# Patient Record
Sex: Female | Born: 1980 | Race: White | Hispanic: No | Marital: Single | Smoking: Current every day smoker
Health system: Southern US, Community
[De-identification: ages and names within clinical notes are randomized; demographics above are authoritative.]

## PROBLEM LIST (undated history)

## (undated) DIAGNOSIS — G43909 Migraine, unspecified, not intractable, without status migrainosus: Secondary | ICD-10-CM

## (undated) DIAGNOSIS — N83209 Unspecified ovarian cyst, unspecified side: Secondary | ICD-10-CM

## (undated) DIAGNOSIS — G43109 Migraine with aura, not intractable, without status migrainosus: Secondary | ICD-10-CM

## (undated) HISTORY — PX: TUBAL LIGATION: SHX77

## (undated) HISTORY — DX: Migraine with aura, not intractable, without status migrainosus: G43.109

## (undated) HISTORY — DX: Unspecified ovarian cyst, unspecified side: N83.209

---

## 2004-08-08 ENCOUNTER — Emergency Department: Payer: Self-pay | Admitting: Emergency Medicine

## 2005-08-10 ENCOUNTER — Emergency Department: Payer: Self-pay | Admitting: Emergency Medicine

## 2005-11-28 IMAGING — US ABDOMEN ULTRASOUND
1 series · 17 of 25 positions shown · non-contrast
Comparison: none

REASON FOR EXAM: Right upper quadrant abdominal pain.
COMMENTS:

[Series 1: abdomen ultrasound · 17 of 50 slices shown]
[im 1/50]
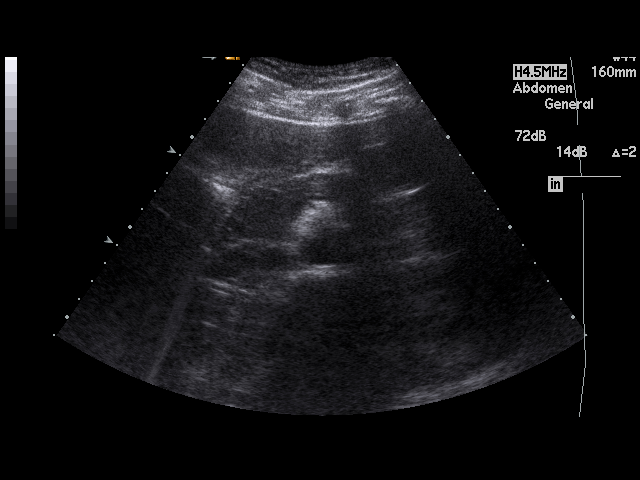
[im 5/50]
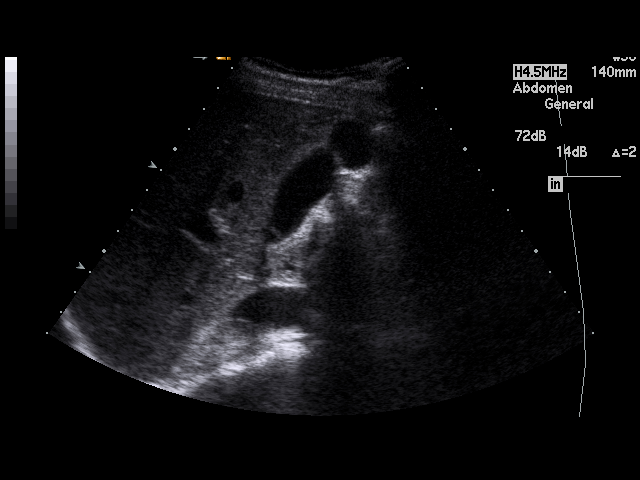
[im 7/50]
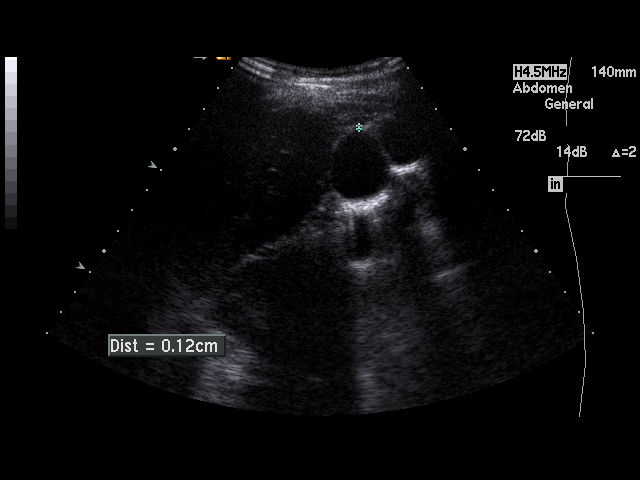
[im 11/50]
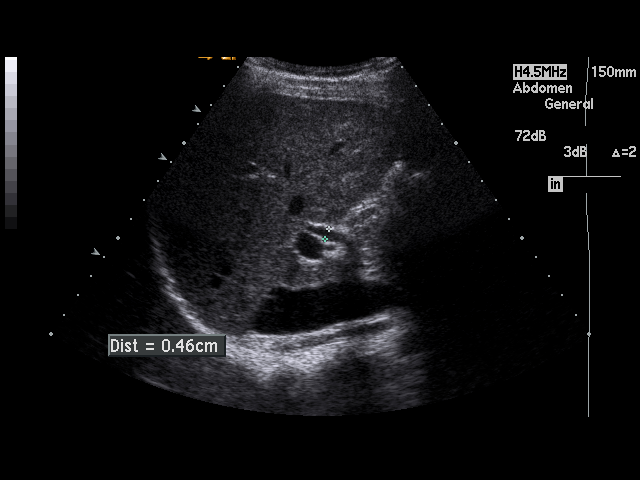
[im 13/50]
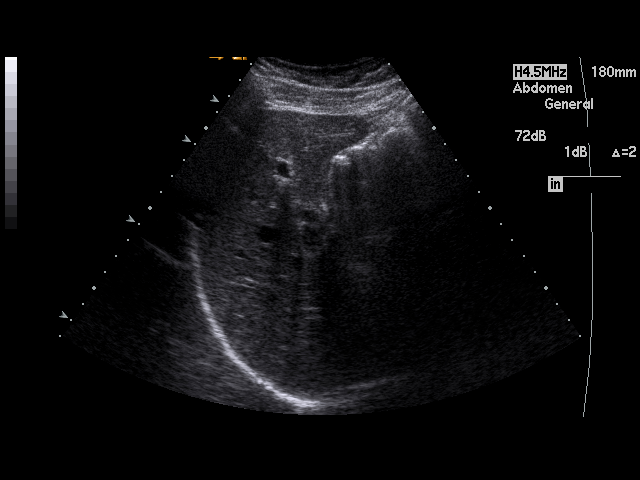
[im 17/50]
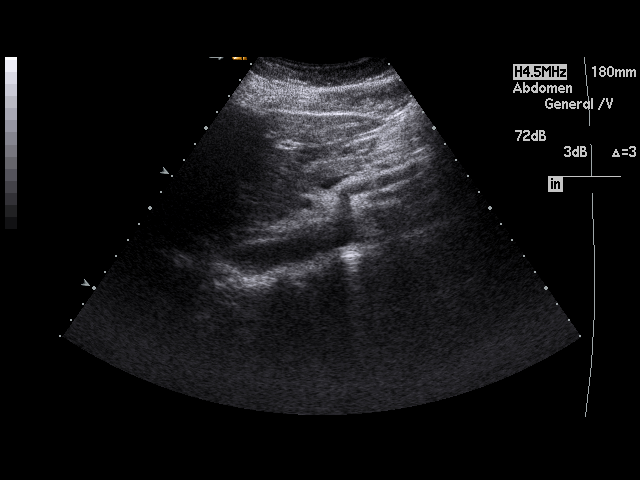
[im 19/50]
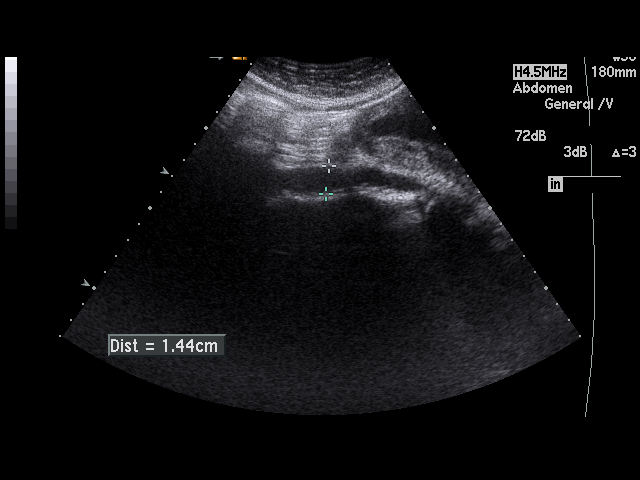
[im 23/50]
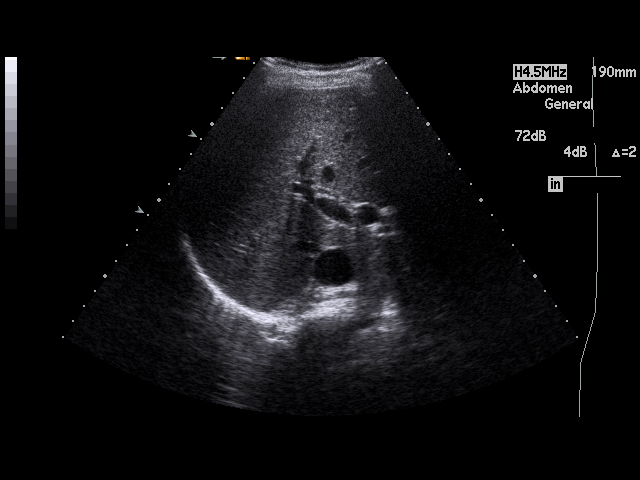
[im 25/50]
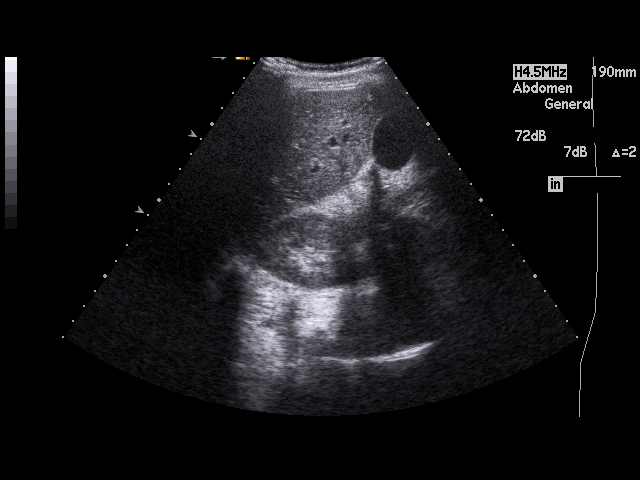
[im 27/50]
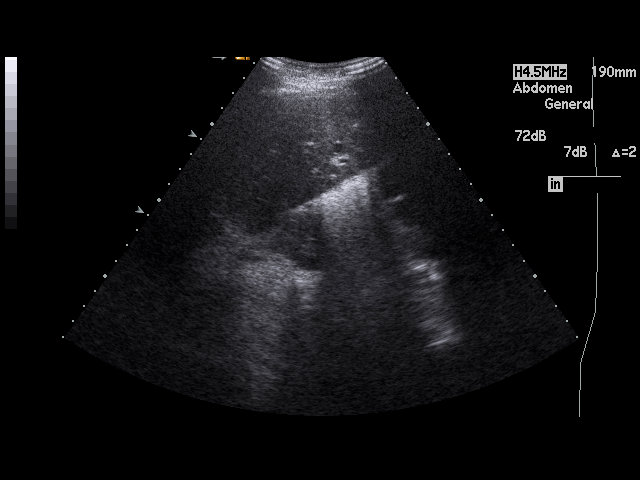
[im 31/50]
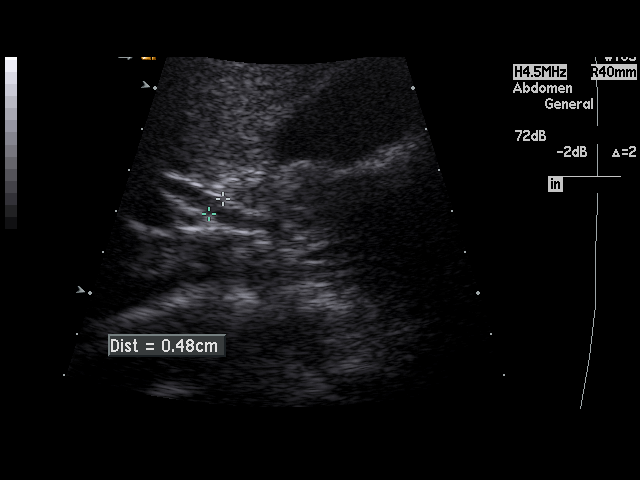
[im 33/50]
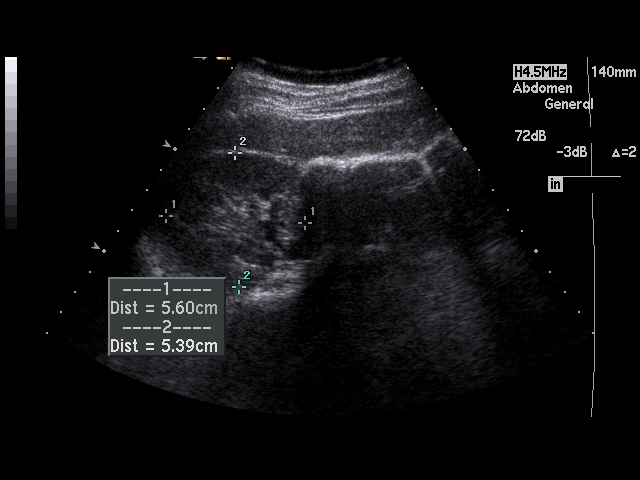
[im 37/50]
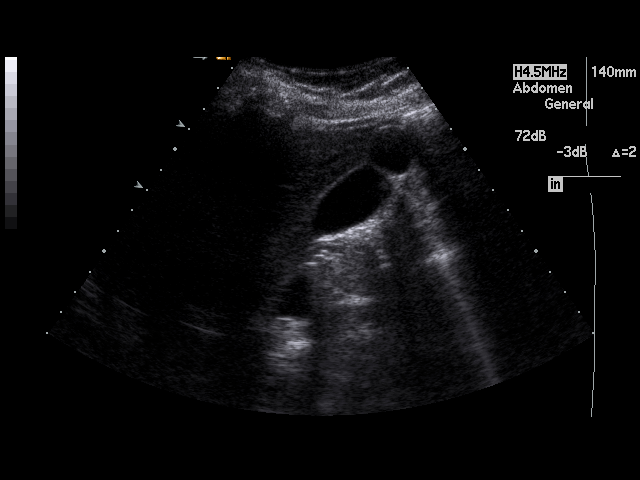
[im 39/50]
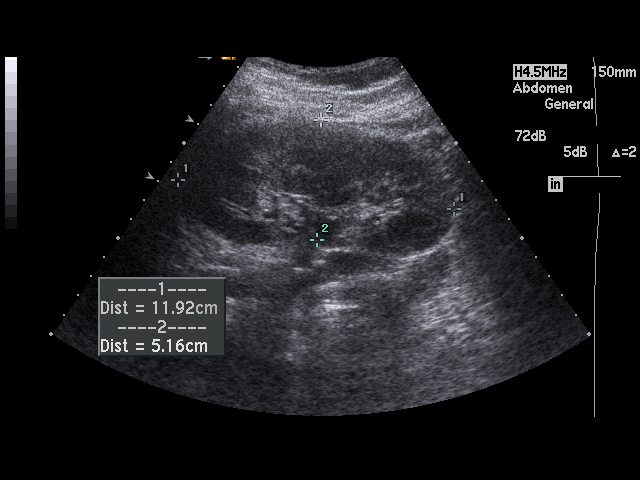
[im 43/50]
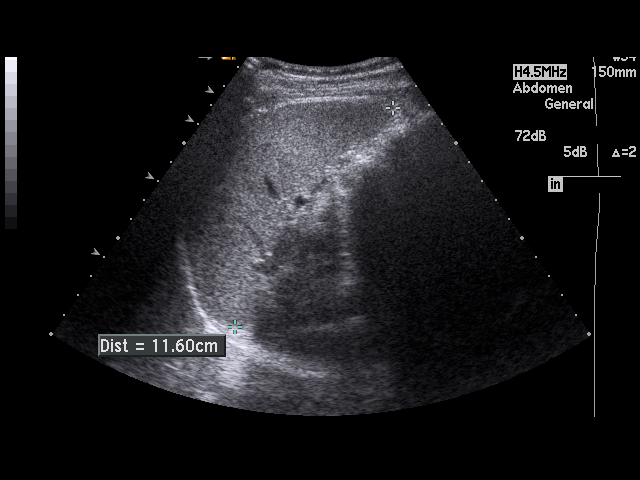
[im 45/50]
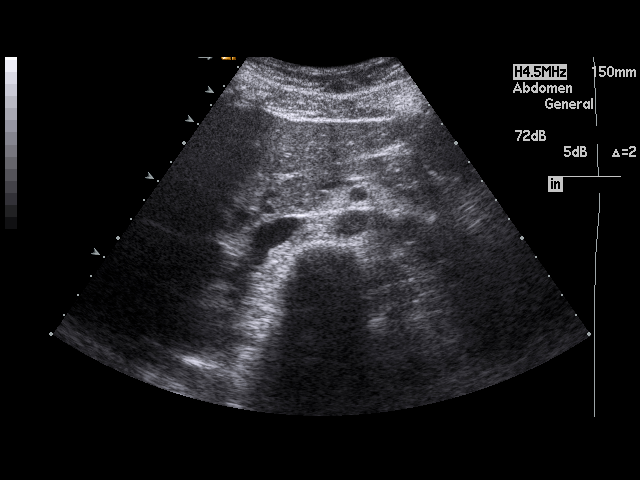
[im 50/50]
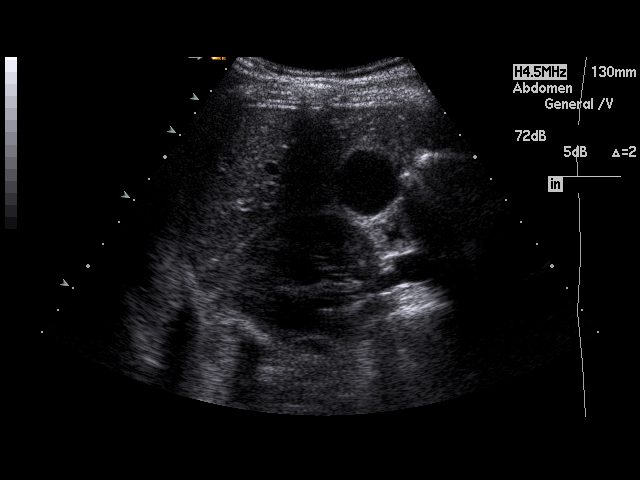

[17 of 25 positions shown; findings below may reference images not displayed]

PROCEDURE:     US  - US ABDOMEN GENERAL SURVEY  - August 10, 2005  [DATE]

RESULT:        The patient is complaining of RIGHT upper quadrant abdominal
discomfort.
The liver exhibits normal echotexture with no evidence of a mass nor ductal
dilation.  The gallbladder is adequately distended with no evidence of
stones, wall thickening or pericholecystic fluid.  The common bile duct
measures 4.6 mm in diameter.  The pancreas, spleen and abdominal aorta are
normal in appearance.    The kidneys exhibit no evidence of obstruction nor
of masses. The RIGHT kidney is smaller than the LEFT.  The LEFT kidney
measures 11.9 cm in length and the RIGHT kidney 9.2 cm in length.  There is
no evidence of ascites.
IMPRESSION: Normal abdominal ultrasound examination.

Preliminary report was sent to the Emergency Room at the conclusion of the
study.

## 2006-01-05 ENCOUNTER — Observation Stay: Payer: Self-pay

## 2006-01-16 ENCOUNTER — Inpatient Hospital Stay: Payer: Self-pay | Admitting: Obstetrics & Gynecology

## 2009-11-14 ENCOUNTER — Ambulatory Visit (HOSPITAL_COMMUNITY): Admission: RE | Admit: 2009-11-14 | Discharge: 2009-11-14 | Payer: Self-pay | Admitting: Family Medicine

## 2016-07-11 ENCOUNTER — Ambulatory Visit
Admission: EM | Admit: 2016-07-11 | Discharge: 2016-07-11 | Disposition: A | Discharge status: Home / Self Care | Payer: Medicaid Other | Attending: Family Medicine | Admitting: Family Medicine

## 2016-07-11 DIAGNOSIS — F17210 Nicotine dependence, cigarettes, uncomplicated: Secondary | ICD-10-CM | POA: Insufficient documentation

## 2016-07-11 DIAGNOSIS — J029 Acute pharyngitis, unspecified: Secondary | ICD-10-CM | POA: Diagnosis present

## 2016-07-11 DIAGNOSIS — J020 Streptococcal pharyngitis: Secondary | ICD-10-CM | POA: Diagnosis not present

## 2016-07-11 HISTORY — DX: Migraine, unspecified, not intractable, without status migrainosus: G43.909

## 2016-07-11 LAB — RAPID STREP SCREEN (MED CTR MEBANE ONLY): Streptococcus, Group A Screen (Direct): POSITIVE — AB

## 2016-07-11 MED ORDER — PENICILLIN G BENZATHINE 1200000 UNIT/2ML IM SUSP
1.2000 10*6.[IU] | Freq: Once | INTRAMUSCULAR | Status: AC
  Administered 2016-07-11: 1.2 10*6.[IU] via INTRAMUSCULAR

## 2016-07-11 NOTE — ED Provider Notes (Signed)
[facility] ____________________________________________  Time seen: Approximately 4:29 PM  I have reviewed the triage vital signs and the nursing notes.   HISTORY  Chief Complaint Sore Throat  HPI Cassandra Lane is a 35 y.o. female presents for complaints of sore throat since last night. Patient reports throat feels very irritated and with pain to swallow. Reports able to swallow and continues to drink fluids but has not been eating as much. Denies known fevers. Denies cough, congestion, nasal drainage or recent sickness. Denies recent antibiotic use. Denies known sick contacts at home sick contacts. Patient reports that she does have 4 young children. Reports has not been taking medication for same complaints.  [facility] Patient's last menstrual period was 07/02/2016. Declines pregnancy. Reports tubal ligation.   Past Medical History:  Diagnosis Date  . Migraines     There are no active problems to display for this patient.   Past Surgical History:  Procedure Laterality Date  . TUBAL LIGATION      Current Outpatient Rx  . Order #: [id number] Class: Historical Med  . Order #: [id number] Class: Historical Med    No current facility-administered medications for this encounter.   Current Outpatient Prescriptions:  .  gabapentin (NEURONTIN) 300 MG capsule, Take 300 mg by mouth 3 (three) times daily., Disp: , Rfl:  .  gabapentin (NEURONTIN) 600 MG tablet, Take 600 mg by mouth 3 (three) times daily., Disp: , Rfl:   Allergies Review of patient's allergies indicates no known allergies.  History reviewed. No pertinent family history.  Social History Social History  Substance Use Topics  . Smoking status: Current Every Day Smoker    Packs/day: 1.00    Types: Cigarettes  . Smokeless tobacco: Never Used  . Alcohol use No    Review of Systems Constitutional: No fever/chills Eyes: No visual changes. ENT: Positive sore  throat. Cardiovascular: Denies chest pain. Respiratory: Denies shortness of breath. Gastrointestinal: No abdominal pain.  No nausea, no vomiting.  No diarrhea.  No constipation. Genitourinary: Negative for dysuria. Musculoskeletal: Negative for back pain. Skin: Negative for rash. Neurological: Negative for headaches, focal weakness or numbness.  10-point ROS otherwise negative.  ____________________________________________   PHYSICAL EXAM:  VITAL SIGNS: ED Triage Vitals  Enc Vitals Group     BP 07/11/16 1613 115/79     Pulse Rate 07/11/16 1613 78     Resp 07/11/16 1613 16     Temp 07/11/16 1613 98.9 F (37.2 C)     Temp Source 07/11/16 1613 Oral     SpO2 07/11/16 1613 100 %     Weight 07/11/16 1614 125 lb (56.7 kg)     Height 07/11/16 1614 5\' 1"  (1.549 m)     Head Circumference --      Peak Flow --      Pain Score 07/11/16 1616 10     Pain Loc --      Pain Edu? --      Excl. in GC? --    Constitutional: Alert and oriented. Well appearing and in no acute distress. Eyes: Conjunctivae are normal. PERRL. EOMI. Head: Atraumatic. No sinus tenderness to palpation. No swelling. No erythema.  Ears: no erythema, normal TMs bilaterally.   Nose: No nasal congestion or rhinorrhea.  Mouth/Throat: Mucous membranes are moist. Moderate pharyngeal erythema, mild bilateral tonsillar swelling, no exudate. No uvular shift or deviation. Neck: No stridor.  No cervical spine tenderness to palpation. Hematological/Lymphatic/Immunilogical: Mild anterior bilateral cervical lymphadenopathy. Cardiovascular: Normal  rate, regular rhythm. Grossly normal heart sounds.  Good peripheral circulation. Respiratory: Normal respiratory effort.  No retractions. Lungs CTAB.No wheezes, rales or rhonchi. Good air movement.  Gastrointestinal: Soft and nontender. Normal Bowel sounds. No CVA tenderness. No hepatosplenomegaly palpated. Musculoskeletal: Ambulatory with steady gait. No cervical, thoracic or lumbar  tenderness to palpation. Neurologic:  Normal speech and language. No gross focal neurologic deficits are appreciated. No gait instability. Skin:  Skin is warm, dry and intact. No rash noted. Psychiatric: Mood and affect are normal. Speech and behavior are normal. ___________________________________________   LABS (all labs ordered are listed, but only abnormal results are displayed)  Labs Reviewed  RAPID STREP SCREEN (NOT AT [facility]) - Abnormal; Notable for the following:       Result Value   Streptococcus, Group A Screen (Direct) POSITIVE (*)    All other components within normal limits     PROCEDURES Procedures    INITIAL IMPRESSION / ASSESSMENT AND PLAN / ED COURSE  Pertinent labs & imaging results that were available during my care of the patient were reviewed by me and considered in my medical decision making (see chart for details).  Well-appearing patient. No acute distress. Presents with complaints of sore throat since last night. Denies accompanying symptoms. Quick strep positive. Discussed option treatments with patient, and patient request penicillin G IM. Penicillin G 1.2 million units given once IM in urgent care. Patient tolerated well. Discussed follow-up and return parameters. Encouraged supportive care and fluids and rest.   Discussed follow up with Primary care physician this week. Discussed follow up and return parameters including no resolution or any worsening concerns. Patient verbalized understanding and agreed to plan.   ____________________________________________   FINAL CLINICAL IMPRESSION(S) / ED DIAGNOSES  Final diagnoses:  Strep pharyngitis     Discharge Medication List as of 07/11/2016  4:41 PM      Note: This dictation was prepared with Dragon dictation along with smaller phrase technology. Any transcriptional errors that result from this process are unintentional.    Clinical Course      Renford Dills, NP 07/11/16 2215

## 2016-07-11 NOTE — ED Triage Notes (Signed)
Sore throat x last night. Hurts to swallow and feels like she is losing her voice. Pain 10/10. Unsure if fevers. Denies other sx.

## 2016-07-11 NOTE — Discharge Instructions (Signed)
Rest. Drink plenty of fluids.  ° °Follow up with your primary care physician this week as needed. Return to Urgent care for new or worsening concerns.  ° °

## 2016-09-19 ENCOUNTER — Encounter: Payer: Self-pay | Admitting: Emergency Medicine

## 2016-09-19 ENCOUNTER — Ambulatory Visit
Admission: EM | Admit: 2016-09-19 | Discharge: 2016-09-19 | Disposition: A | Discharge status: Home / Self Care | Payer: Medicaid Other | Attending: Internal Medicine | Admitting: Internal Medicine

## 2016-09-19 DIAGNOSIS — J029 Acute pharyngitis, unspecified: Secondary | ICD-10-CM | POA: Diagnosis not present

## 2016-09-19 DIAGNOSIS — Z79899 Other long term (current) drug therapy: Secondary | ICD-10-CM | POA: Diagnosis not present

## 2016-09-19 DIAGNOSIS — F17210 Nicotine dependence, cigarettes, uncomplicated: Secondary | ICD-10-CM | POA: Insufficient documentation

## 2016-09-19 LAB — RAPID STREP SCREEN (MED CTR MEBANE ONLY): STREPTOCOCCUS, GROUP A SCREEN (DIRECT): NEGATIVE

## 2016-09-19 MED ORDER — LIDOCAINE VISCOUS 2 % MT SOLN: 10.0000 mL | OROMUCOSAL | 0 refills | Status: DC | PRN

## 2016-09-19 NOTE — Discharge Instructions (Signed)
Recommend start Lidocaine mouth wash - swish and spit out every 4 hours as needed for pain. Follow-up in 3 to 4 days with your primary care provider if not improving.

## 2016-09-19 NOTE — ED Triage Notes (Signed)
Patient c/o sore throat that started yesterday.  

## 2016-09-19 NOTE — ED Provider Notes (Signed)
CSN: [id number]     Arrival date & time 09/19/16  [id number] History   First MD Initiated Contact with Patient 09/19/16 1011     Chief Complaint  Patient presents with  . Sore Throat   (Consider location/radiation/quality/duration/timing/severity/associated sxs/prior Treatment) 35 year old female presents with sore throat that started yesterday. Also experiencing post nasal drainage, slight nasal congestion and cough. Denies any fever or GI symptoms. Has taken Sucrets with minimal relief. Had strep in Oct 2017, symptoms feel similar. Son also was sick with URI symptoms and had pink eye earlier this week- has recovered now. She does smoke about 1 pack per day and takes Neurontin daily for migraines. 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    The history is provided by the patient.    Past Medical History:  Diagnosis Date  . Migraines    Past Surgical History:  Procedure Laterality Date  . TUBAL LIGATION     History reviewed. No pertinent family history. Social History  Substance Use Topics  . Smoking status: Current Every Day Smoker    Packs/day: 1.00    Types: Cigarettes  . Smokeless tobacco: Never Used  . Alcohol use No   OB History    No data available     Review of Systems  Constitutional: Positive for fatigue. Negative for appetite change, chills and fever.  HENT: Positive for postnasal drip and sore throat. Negative for congestion, ear discharge, ear pain, sinus pain and sinus pressure.   Respiratory: Positive for cough. Negative for chest tightness, shortness of breath and wheezing.   Cardiovascular: Negative for chest pain.  Gastrointestinal: Negative for abdominal pain, diarrhea, nausea and vomiting.  Musculoskeletal: Negative for neck pain and neck stiffness.  Skin: Negative for rash.  Neurological: Positive for headaches. Negative for dizziness, weakness and light-headedness.  Hematological: Negative for  adenopathy.    Allergies  Patient has no known allergies.  Home Medications   Prior to Admission medications   Medication Sig Start Date End Date Taking? Authorizing Provider  gabapentin (NEURONTIN) 600 MG tablet Take 600 mg by mouth 2 (two) times daily.     Historical Provider, MD  lidocaine (XYLOCAINE) 2 % solution Use as directed 10 mLs in the mouth or throat every 4 (four) hours as needed (for sore throat). 09/19/16   Sudie GrumblingAnn Berry Jasmine Mcbeth, NP   Meds Ordered and Administered this Visit  Medications - No data to display  BP 120/90 (BP Location: Left Arm)   Pulse 91   Temp 98.5 F (36.9 C) (Oral)   Resp 16   Ht 5\' 1"  (1.549 m)   Wt 125 lb (56.7 kg)   LMP 09/16/2016 (Exact Date)   SpO2 100%   BMI 23.62 kg/m  No data found.   Physical Exam  Constitutional: She is oriented to person, place, and time. She appears well-developed and well-nourished. No distress.  HENT:  Head: Normocephalic and atraumatic.  Right Ear: Hearing, tympanic membrane, external ear and ear canal normal.  Left Ear: Hearing, tympanic membrane, external ear and ear canal normal.  Nose: Nose normal. Right sinus exhibits no maxillary sinus tenderness and no frontal sinus tenderness. Left sinus exhibits no maxillary sinus tenderness and no frontal sinus tenderness.  Mouth/Throat: Uvula is midline and mucous membranes are normal. Posterior oropharyngeal erythema present. No tonsillar exudate.  Neck: Normal range of motion. Neck supple.  Cardiovascular: Normal rate, regular rhythm and normal heart sounds.   Pulmonary/Chest: Effort normal  and breath sounds normal. No respiratory distress. She has no decreased breath sounds. She has no wheezes. She has no rhonchi.  Lymphadenopathy:    She has no cervical adenopathy.  Neurological: She is alert and oriented to person, place, and time.  Skin: Skin is warm and dry. Capillary refill takes less than 2 seconds.  Psychiatric: She has a normal mood and affect. Her behavior  is normal. Judgment and thought content normal.    Urgent Care Course   Clinical Course     Procedures (including critical care time)  Labs Review Labs Reviewed  RAPID STREP SCREEN (NOT AT [facility])  CULTURE, GROUP A STREP [facility]    Imaging Review No results found.   Visual Acuity Review  Right Eye Distance:   Left Eye Distance:   Bilateral Distance:    Right Eye Near:   Left Eye Near:    Bilateral Near:         MDM   1. Pharyngitis, unspecified etiology    Reviewed negative rapid strep test with patient. Probably has a viral illness. Recommend take Lidocaine mouth wash- swish and spit out every 4 hours as needed for pain. Rest. Encouraged to decrease smoking. Follow-up with her primary care provider in 3 to 4 days if not improving.      Sudie GrumblingAnn Berry Quinette Hentges, NP 09/19/16 1034

## 2016-09-21 LAB — CULTURE, GROUP A STREP (THRC)

## 2016-09-23 ENCOUNTER — Telehealth: Payer: Self-pay

## 2016-09-23 ENCOUNTER — Telehealth: Payer: Self-pay | Admitting: Internal Medicine

## 2016-09-23 MED ORDER — PENICILLIN V POTASSIUM 500 MG PO TABS: 500.0000 mg | ORAL_TABLET | Freq: Two times a day (BID) | ORAL | 0 refills | Status: AC

## 2016-09-23 NOTE — Telephone Encounter (Signed)
Please let patient know that throat culture was positive for small number of strep.  If still having sore throat/fever, ok to send rx for penicillin V 500mg  bid x 10 d # 20 no refills.  Recheck as needed.  LM

## 2016-09-23 NOTE — Telephone Encounter (Signed)
Spoke to patient and informed her of her positive strep culture and that a antibiotic had been called in for her. She mentioned that she has had some yellow dots come up in her throat and on her tongue. I spoke with Colon FlatteryBill Roemer, PA and he suggested picking up the medication and giving it a few days to start working, if she doesn't see any improvement in a couple days to follow up.

## 2016-09-29 DIAGNOSIS — N83209 Unspecified ovarian cyst, unspecified side: Secondary | ICD-10-CM

## 2016-09-29 HISTORY — DX: Unspecified ovarian cyst, unspecified side: N83.209

## 2016-11-23 ENCOUNTER — Emergency Department: Payer: Medicaid Other

## 2016-11-23 ENCOUNTER — Encounter: Payer: Self-pay | Admitting: Emergency Medicine

## 2016-11-23 ENCOUNTER — Emergency Department
Admission: EM | Admit: 2016-11-23 | Discharge: 2016-11-23 | Disposition: A | Discharge status: Home / Self Care | Payer: Medicaid Other | Attending: Emergency Medicine | Admitting: Emergency Medicine

## 2016-11-23 DIAGNOSIS — Z79899 Other long term (current) drug therapy: Secondary | ICD-10-CM | POA: Diagnosis not present

## 2016-11-23 DIAGNOSIS — R1030 Lower abdominal pain, unspecified: Secondary | ICD-10-CM | POA: Diagnosis present

## 2016-11-23 DIAGNOSIS — R102 Pelvic and perineal pain: Secondary | ICD-10-CM

## 2016-11-23 DIAGNOSIS — R55 Syncope and collapse: Secondary | ICD-10-CM

## 2016-11-23 DIAGNOSIS — N83202 Unspecified ovarian cyst, left side: Secondary | ICD-10-CM | POA: Insufficient documentation

## 2016-11-23 DIAGNOSIS — F17210 Nicotine dependence, cigarettes, uncomplicated: Secondary | ICD-10-CM | POA: Insufficient documentation

## 2016-11-23 LAB — URINALYSIS, COMPLETE (UACMP) WITH MICROSCOPIC
BILIRUBIN URINE: NEGATIVE
Bacteria, UA: NONE SEEN
GLUCOSE, UA: NEGATIVE mg/dL
Hgb urine dipstick: NEGATIVE
KETONES UR: NEGATIVE mg/dL
LEUKOCYTES UA: NEGATIVE
NITRITE: NEGATIVE
PH: 7 (ref 5.0–8.0)
PROTEIN: NEGATIVE mg/dL
RBC / HPF: NONE SEEN RBC/hpf (ref 0–5)
Specific Gravity, Urine: 1.006 (ref 1.005–1.030)

## 2016-11-23 LAB — CBC
HEMATOCRIT: 37.4 % (ref 35.0–47.0)
Hemoglobin: 12.7 g/dL (ref 12.0–16.0)
MCH: 28.1 pg (ref 26.0–34.0)
MCHC: 34.1 g/dL (ref 32.0–36.0)
MCV: 82.4 fL (ref 80.0–100.0)
PLATELETS: 321 10*3/uL (ref 150–440)
RBC: 4.54 MIL/uL (ref 3.80–5.20)
RDW: 16.3 % — AB (ref 11.5–14.5)
WBC: 10.4 10*3/uL (ref 3.6–11.0)

## 2016-11-23 LAB — COMPREHENSIVE METABOLIC PANEL
ALT: 13 U/L — ABNORMAL LOW (ref 14–54)
AST: 17 U/L (ref 15–41)
Albumin: 3.8 g/dL (ref 3.5–5.0)
Alkaline Phosphatase: 58 U/L (ref 38–126)
Anion gap: 5 (ref 5–15)
BILIRUBIN TOTAL: 0.2 mg/dL — AB (ref 0.3–1.2)
BUN: 8 mg/dL (ref 6–20)
CHLORIDE: 108 mmol/L (ref 101–111)
CO2: 25 mmol/L (ref 22–32)
CREATININE: 0.74 mg/dL (ref 0.44–1.00)
Calcium: 9.1 mg/dL (ref 8.9–10.3)
Glucose, Bld: 94 mg/dL (ref 65–99)
POTASSIUM: 4.1 mmol/L (ref 3.5–5.1)
Sodium: 138 mmol/L (ref 135–145)
TOTAL PROTEIN: 6.9 g/dL (ref 6.5–8.1)

## 2016-11-23 LAB — LIPASE, BLOOD: LIPASE: 27 U/L (ref 11–51)

## 2016-11-23 LAB — POCT PREGNANCY, URINE: Preg Test, Ur: NEGATIVE

## 2016-11-23 MED ORDER — SODIUM CHLORIDE 0.9 % IV BOLUS (SEPSIS)
1000.0000 mL | Freq: Once | INTRAVENOUS | Status: AC
  Administered 2016-11-23: 1000 mL via INTRAVENOUS

## 2016-11-23 MED ORDER — KETOROLAC TROMETHAMINE 30 MG/ML IJ SOLN
INTRAMUSCULAR | Status: AC
  Administered 2016-11-23: 15 mg via INTRAVENOUS
  Filled 2016-11-23: qty 1

## 2016-11-23 MED ORDER — KETOROLAC TROMETHAMINE 30 MG/ML IJ SOLN
15.0000 mg | Freq: Once | INTRAMUSCULAR | Status: AC
  Administered 2016-11-23: 15 mg via INTRAVENOUS

## 2016-11-23 NOTE — ED Provider Notes (Signed)
Time Seen: Approximately 1434  I have reviewed the triage notes  Chief Complaint: Abdominal Pain; Vaginal Pain; and Loss of Consciousness   History of Present Illness: Cassandra Lane is a 36 y.o. female who states that she was working in the house today when she had sudden onset of lower middle quadrant abdominal pain and describes suprapubic vaginal discomfort. She states it felt like she had "" something coming out of her vagina "". She denies any recent sexual activity or vaginal discharge or bleeding. She states shortly after the pain she started feeling very lightheaded and nauseated and sweaty and had a brief syncopal episode. She denies any preexistent chest pain, shortness of breath or any other concerns. She states she still has the pain but otherwise denies any nausea vomiting or fever. There was no witnessed seizure activity.   Past Medical History:  Diagnosis Date  . Migraines     There are no active problems to display for this patient.   Past Surgical History:  Procedure Laterality Date  . TUBAL LIGATION      Past Surgical History:  Procedure Laterality Date  . TUBAL LIGATION      Current Outpatient Rx  . Order #: [id number] Class: Historical Med  . Order #: [id number] Class: Normal    Allergies:  Patient has no known allergies.  Family History: No family history on file.  Social History: Social History  Substance Use Topics  . Smoking status: Current Every Day Smoker    Packs/day: 1.00    Types: Cigarettes  . Smokeless tobacco: Never Used  . Alcohol use No     Review of Systems:   10 point review of systems was performed and was otherwise negative:  Constitutional: No fever Eyes: No visual disturbances ENT: No sore throat, ear pain Cardiac: No chest pain Respiratory: No shortness of breath, wheezing, or stridor Abdomen: Abdominal pain is lower middle region without diarrhea or constipation. Endocrine: No weight loss, No night sweats Extremities:  No peripheral edema, cyanosis Skin: No rashes, easy bruising Neurologic: No focal weakness, trouble with speech or swollowing Urologic: No dysuria, Hematuria, or urinary frequency She denies any vaginal discharge or bleeding  Physical Exam:  ED Triage Vitals  Enc Vitals Group     BP 11/23/16 1232 98/71     Pulse Rate 11/23/16 1232 77     Resp 11/23/16 1232 16     Temp 11/23/16 1232 98.3 F (36.8 C)     Temp Source 11/23/16 1232 Oral     SpO2 11/23/16 1232 97 %     Weight 11/23/16 1232 120 lb (54.4 kg)     Height 11/23/16 1232 5' (1.524 m)     Head Circumference --      Peak Flow --      Pain Score 11/23/16 1246 10     Pain Loc --      Pain Edu? --      Excl. in GC? --     General: Awake , Alert , and Oriented times 3; GCS 15 Head: Normal cephalic , atraumatic Eyes: Pupils equal , round, reactive to light Nose/Throat: No nasal drainage, patent upper airway without erythema or exudate.  Neck: Supple, Full range of motion, No anterior adenopathy or palpable thyroid masses Lungs: Clear to ascultation without wheezes , rhonchi, or rales Heart: Regular rate, regular rhythm without murmurs , gallops , or rubs Abdomen: Patient has tenderness in the lower middle quadrant without any rebound guarding or rigidity.  No  focal tenderness over McBurney's point, negative Murphy's sign.      Extremities: 2 plus symmetric pulses. No edema, clubbing or cyanosis Neurologic: normal ambulation, Motor symmetric without deficits, sensory intact Skin: warm, dry, no rashes Pelvic exam was chaperone present shows some mild tenderness lower middle quadrant. Patient had discomfort but could not complete the rest of the bimanual exam. There was no visible discharge or vaginal lesions or any palpable abnormalities in the vaginal canal  Labs:   All laboratory work was reviewed including any pertinent negatives or positives listed below:  Labs Reviewed  COMPREHENSIVE METABOLIC PANEL - Abnormal; Notable  for the following:       Result Value   ALT 13 (*)    Total Bilirubin 0.2 (*)    All other components within normal limits  CBC - Abnormal; Notable for the following:    RDW 16.3 (*)    All other components within normal limits  URINALYSIS, COMPLETE (UACMP) WITH MICROSCOPIC - Abnormal; Notable for the following:    Color, Urine YELLOW (*)    APPearance CLEAR (*)    Squamous Epithelial / LPF 0-5 (*)    All other components within normal limits  LIPASE, BLOOD  POC URINE PREG, ED  POCT PREGNANCY, URINE  Laboratory work was reviewed and showed no clinically significant abnormalities.   Radiology: "US Transvaginal Non-ob  Result Date: 11/23/2016 CLINICAL DATA:  Suprapubic pain as well as diffuse pelvic pain 4-5 days. EXAM: TRANSABDOMINAL AND TRANSVAGINAL ULTRASOUND OF PELVIS TECHNIQUE: Both transabdominal and transvaginal ultrasound examinations of the pelvis were performed. Transabdominal technique was performed for global imaging of the pelvis including uterus, ovaries, adnexal regions, and pelvic cul-de-sac. It was necessary to proceed with endovaginal exam following the transabdominal exam to visualize the endometrium and ovaries. COMPARISON:  None FINDINGS: Uterus Measurements: 5.3 x 6.9 x 9.0 cm. No fibroids or other mass visualized. Endometrium Thickness: 10 mm.  No focal abnormality visualized. Right ovary Only visualized transabdominally. Measurements: 2.9 x 2.9 x 2.8 cm. Normal appearance/no adnexal mass. Normal color flow. Left ovary Measurements: 2.4 x 2.5 x 4.2 cm. Normal color flow. Oval 2.5 cm complex cystic structure without internal vascularity. This finding may be seen due to hemorrhagic cyst, endometrioma, dermoid or tubo-ovarian abscess. Other findings Small amount of free pelvic fluid. IMPRESSION: 2.5 cm complex left ovarian cystic mass. Differential diagnosis includes endometrioma, tubo-ovarian abscess, dermoid or hemorrhagic cyst. Small amount free pelvic fluid. Recommend  follow-up ultrasound 6 weeks. Uterus and right ovary. Electronically Signed   By: Elberta Fortis M.D.   On: 11/23/2016 16:43   US Pelvis Complete  Result Date: 11/23/2016 CLINICAL DATA:  Suprapubic pain as well as diffuse pelvic pain 4-5 days. EXAM: TRANSABDOMINAL AND TRANSVAGINAL ULTRASOUND OF PELVIS TECHNIQUE: Both transabdominal and transvaginal ultrasound examinations of the pelvis were performed. Transabdominal technique was performed for global imaging of the pelvis including uterus, ovaries, adnexal regions, and pelvic cul-de-sac. It was necessary to proceed with endovaginal exam following the transabdominal exam to visualize the endometrium and ovaries. COMPARISON:  None FINDINGS: Uterus Measurements: 5.3 x 6.9 x 9.0 cm. No fibroids or other mass visualized. Endometrium Thickness: 10 mm.  No focal abnormality visualized. Right ovary Only visualized transabdominally. Measurements: 2.9 x 2.9 x 2.8 cm. Normal appearance/no adnexal mass. Normal color flow. Left ovary Measurements: 2.4 x 2.5 x 4.2 cm. Normal color flow. Oval 2.5 cm complex cystic structure without internal vascularity. This finding may be seen due to hemorrhagic cyst, endometrioma, dermoid or tubo-ovarian abscess. Other  findings Small amount of free pelvic fluid. IMPRESSION: 2.5 cm complex left ovarian cystic mass. Differential diagnosis includes endometrioma, tubo-ovarian abscess, dermoid or hemorrhagic cyst. Small amount free pelvic fluid. Recommend follow-up ultrasound 6 weeks. Uterus and right ovary. Electronically Signed   By: Elberta Fortisaniel  Boyle M.D.   On: 11/23/2016 16:43  "  I personally reviewed the radiologic studies       ED Course:  Patient received IV fluids and IV Toradol with resolution and discomfort and given her current clinical presentation and objective findings appears that she has a hemorrhagic ovarian cyst which goes with the sudden onset of pain without evidence of ovarian torsion, etc. Pregnancy test is negative.  The patient's syncopal episode is vagal in nature and she has no previous cardiovascular history and no significant findings on her EKG.     Assessment: * Vasovagal syncope Left ovarian cyst  Final Clinical Impression  Final diagnoses:  Syncope and collapse  Cyst of left ovary     Plan: * The patient was referred to OB/GYN unassigned and advised drink plenty of fluids and take ibuprofen and Tylenol for pain. She's been advised to return here if she develops chest pain, shortness of breath, increasing abdominal pain. I felt this was unlikely to be an tubo-ovarian abscess from a sexually transmitted disease. She is afebrile, normal white blood cell count and states she's not been sexually active and there is no vaginal discharge on brief exam. Patient was advised to return immediately if condition worsens. Patient was advised to follow up with their primary care physician or other specialized physicians involved in their outpatient care. The patient and/or family member/power of attorney had laboratory results reviewed at the bedside. All questions and concerns were addressed and appropriate discharge instructions were distributed by the nursing staff.             Jennye MoccasinBrian S Quigley, MD 11/23/16 [phone]

## 2016-11-23 NOTE — ED Notes (Addendum)
Abdominal pain with syncope. Pain began today. Pt alert and oriented X4, active, cooperative, pt in NAD. RR even and unlabored, color WNL.   Pt c/o headache.

## 2016-11-23 NOTE — Discharge Instructions (Signed)
Please drink plenty of fluids and continue with over-the-counter ibuprofen and/or Tylenol for pain. Return emergency department especially for fever, pass out especially suddenly, chest pain, shortness of breath, or any other new concerns.  Please return immediately if condition worsens. Please contact her primary physician or the physician you were given for referral. If you have any specialist physicians involved in her treatment and plan please also contact them. Thank you for using [facility].

## 2016-11-23 NOTE — ED Triage Notes (Addendum)
Pt lower abd pain that started today. Pt also reports pain in vaginal area. Denies redness, itching or discharge. Pt states it feels like something in going to fall out. Denies fever. Denies urinary complaints.  Pt states she also passed out today. remembers being hot and sweat and then falling. States it was witness by son.

## 2016-11-23 NOTE — ED Notes (Signed)
Patient transported to Ultrasound 

## 2016-11-23 NOTE — ED Notes (Signed)
Pt alert and oriented X4, active, cooperative, pt in NAD. RR even and unlabored, color WNL.  Pt informed to return if any life threatening symptoms occur.   

## 2016-11-26 ENCOUNTER — Ambulatory Visit (INDEPENDENT_AMBULATORY_CARE_PROVIDER_SITE_OTHER): Payer: Medicaid Other | Admitting: Obstetrics and Gynecology

## 2016-11-26 ENCOUNTER — Encounter: Payer: Self-pay | Admitting: Obstetrics and Gynecology

## 2016-11-26 VITALS — BP 118/74 | Ht 61.0 in | Wt 124.0 lb

## 2016-11-26 DIAGNOSIS — R1030 Lower abdominal pain, unspecified: Secondary | ICD-10-CM | POA: Diagnosis not present

## 2016-11-26 DIAGNOSIS — N83202 Unspecified ovarian cyst, left side: Secondary | ICD-10-CM | POA: Diagnosis not present

## 2016-11-26 NOTE — Patient Instructions (Signed)
Return to clinic for abdominal pain that does not go away or is get Ovarian Cyst An ovarian cyst is a fluid-filled sac on an ovary. The ovaries are organs that make eggs in women. Most ovarian cysts go away on their own and are not cancerous (are benign). Some cysts need treatment. Follow these instructions at home:  Take over-the-counter and prescription medicines only as told by your doctor.  Do not drive or use heavy machinery while taking prescription pain medicine.  Get pelvic exams and Pap tests as often as told by your doctor.  Return to your normal activities as told by your doctor. Ask your doctor what activities are safe for you.  Do not use any products that contain nicotine or tobacco, such as cigarettes and e-cigarettes. If you need help quitting, ask your doctor.  Keep all follow-up visits as told by your doctor. This is important. Contact a doctor if:  Your periods are:  Late.  Irregular.  Painful.  Your periods stop.  You have pelvic pain that does not go away.  You have pressure on your bladder.  You have trouble making your bladder empty when you pee (urinate).  You have pain during sex.  You have any of the following in your belly (abdomen):  A feeling of fullness.  Pressure.  Discomfort.  Pain that does not go away.  Swelling.  You feel sick most of the time.  You have trouble pooping (have constipation).  You are not as hungry as usual (you lose your appetite).  You get very bad acne.  You start to have more hair on your body and face.  You are gaining weight or losing weight without changing your exercise and eating habits.  You think you may be pregnant. Get help right away if:  You have belly pain that is very bad or gets worse.  You cannot eat or drink without throwing up (vomiting).  You suddenly get a fever.  Your period is a lot heavier than usual. This information is not intended to replace advice given to you by your  health care provider. Make sure you discuss any questions you have with your health care provider. Document Released: 03/03/2008 Document Revised: 04/04/2016 Document Reviewed: 02/17/2016 Elsevier Interactive Patient Education  2017 [employer] ting worse.

## 2016-11-26 NOTE — Progress Notes (Signed)
New Patient Visit  Chief Complaint: ER Follow up for ovarian cyst and abdominal pain  History of Present Illness: The patient is a 36 y.o. [phone] female who presents in follow-up from an ER visit on 11/23/16 for acute-onset lower abdominal pain and a syncopal episode. The patient states that she has a history of syncopal episodes over the past 17 years and in nature it is typical for her.  However, when she came-to from her syncopal episode she had severe lower abdominal pain.  The pain was located in her mid-lower abdomen (suprapubic). It does radiate to her back.  She rates the pain as 7/10.  It lasts for about 1 minute then goes away. It occurs about 5-10 times per day.  Nothing makes it better or worse.  She feels hot and lightheaded when it happens. The pain is improved since her visit to the ER.  In the ER, she had a normal lab workup, normal UA. She had a pelvic u/s that showed a 2.5cm left ovarian cyst, called likely hemorrhagic.  The radiologist could not rule out other types of cysts (endometrioma, teratoma).    Past Medical History:  Diagnosis Date  . Migraines   . Ovarian cyst 2018    Past Surgical History:  Procedure Laterality Date  . TUBAL LIGATION     Current Outpatient Prescriptions on File Prior to Visit  Medication Sig Dispense Refill  . gabapentin (NEURONTIN) 600 MG tablet Take 600 mg by mouth 2 (two) times daily.     Marland Kitchen lidocaine (XYLOCAINE) 2 % solution Use as directed 10 mLs in the mouth or throat every 4 (four) hours as needed (for sore throat). (Patient not taking: Reported on 11/26/2016) 100 mL 0   No current facility-administered medications on file prior to visit.    Allergies:   No Known Allergies  OB History    Gravida Para Term Preterm AB Living   4 3 3  0 1 3   SAB TAB Ectopic Multiple Live Births   0       3      Gynecologic History: Patient's last menstrual period was 11/09/2016.  Cycles come every 28 days, last 3-4 days. Last pap smear: 1 year  ago, normal History of abnormal paps: no History of STDs: no Contraception: yes, BTL  Family History  Problem Relation Age of Onset  . Diabetes Father   . Stroke Maternal Grandfather     Social History  Substance Use Topics  . Smoking status: Current Every Day Smoker    Packs/day: 1.00    Types: Cigarettes  . Smokeless tobacco: Never Used  . Alcohol use No    Review of Systems  Constitutional: Negative.   HENT: Negative.   Eyes: Negative.   Respiratory: Negative.   Cardiovascular: Negative.   Gastrointestinal: Positive for abdominal pain. Negative for blood in stool, constipation, diarrhea, nausea and vomiting.  Genitourinary: Negative for dysuria, frequency and hematuria.  Musculoskeletal: Negative.   Skin: Negative.   Neurological: Positive for dizziness, loss of consciousness and headaches.  Psychiatric/Behavioral: Negative.    Physical Exam: BP 118/74   Ht 5\' 1"  (1.549 m)   Wt 124 lb (56.2 kg)   LMP 11/09/2016   BMI 23.43 kg/m   Physical Exam  Constitutional: She is oriented to person, place, and time. She appears well-developed and well-nourished. No distress.  HENT:  Head: Normocephalic and atraumatic.  Eyes: Conjunctivae are normal.  Neck: Normal range of motion. Neck supple. No thyromegaly present.  Cardiovascular: Normal rate, regular rhythm and normal heart sounds.  Exam reveals no gallop and no friction rub.   No murmur heard. Pulmonary/Chest: Effort normal and breath sounds normal. She has no wheezes.  Abdominal: Soft. She exhibits no distension and no mass. There is tenderness (mild RLQ). There is no rebound and no guarding. No hernia. Hernia confirmed negative in the right inguinal area and confirmed negative in the left inguinal area.  Genitourinary: Pelvic exam was performed with patient supine. There is no rash, tenderness or lesion on the right labia. There is no rash, tenderness or lesion on the left labia. Uterus is not deviated, not enlarged, not  fixed and not tender. Cervix exhibits no motion tenderness, no discharge and no friability. Right adnexum displays tenderness and fullness. Right adnexum displays no mass. Left adnexum displays no mass and no fullness. No erythema or tenderness in the vagina. No vaginal discharge found.  Musculoskeletal: Normal range of motion.  Lymphadenopathy:       Right: No inguinal adenopathy present.       Left: No inguinal adenopathy present.  Neurological: She is alert and oriented to person, place, and time.  Skin: Skin is warm and dry. No rash noted.  Psychiatric: She has a normal mood and affect. Her behavior is normal.   Assessment Plan: 36 y.o. [phone] [id number] female with abdominal pain and left ovarian cyst.  Left ovarian cyst Likely benign cyst.  Repeat ultrasound in 5 weeks. Discussed possibility of endometrioma or teratoma.  Discussed risk of torsion and precautions given.  Lower abdominal pain Gonorrhea/chlamydia checked today.  Negative urinalysis in ER.  Patient instructed to return to clinic if worsens.  Thomasene MohairStephen Riese Hellard, MD 11/26/2016 1:13 PM

## 2016-11-26 NOTE — Assessment & Plan Note (Signed)
Likely benign cyst.  Repeat ultrasound in 5 weeks. Discussed possibility of endometrioma or teratoma.  Discussed risk of torsion and precautions given.

## 2016-11-26 NOTE — Assessment & Plan Note (Signed)
Gonorrhea/chlamydia checked today.  Negative urinalysis in ER.  Patient instructed to return to clinic if worsens.

## 2016-11-29 LAB — GC/CHLAMYDIA PROBE AMP
CHLAMYDIA, DNA PROBE: NEGATIVE
Neisseria gonorrhoeae by PCR: NEGATIVE

## 2017-01-02 ENCOUNTER — Encounter: Payer: Self-pay | Admitting: Obstetrics and Gynecology

## 2017-01-02 ENCOUNTER — Ambulatory Visit (INDEPENDENT_AMBULATORY_CARE_PROVIDER_SITE_OTHER): Payer: Medicaid Other | Admitting: Obstetrics and Gynecology

## 2017-01-02 ENCOUNTER — Ambulatory Visit (INDEPENDENT_AMBULATORY_CARE_PROVIDER_SITE_OTHER): Payer: Medicaid Other

## 2017-01-02 VITALS — BP 110/70 | HR 90 | Ht 61.0 in | Wt 123.0 lb

## 2017-01-02 DIAGNOSIS — N83202 Unspecified ovarian cyst, left side: Secondary | ICD-10-CM | POA: Diagnosis not present

## 2017-01-02 DIAGNOSIS — R1030 Lower abdominal pain, unspecified: Secondary | ICD-10-CM

## 2017-01-02 NOTE — Progress Notes (Signed)
     Gynecology Ultrasound Follow Up   Chief Complaint  Patient presents with  . u/s f/up    pelvic pain   History of Present Illness: Patient is a 36 y.o. female who presents today for ultrasound evaluation of pelvic pain and left ovarian cyst. She states today that her pain is back to a normal level of pain and she has no residual symptoms.  Ultrasound demonstrates the following findgins Adnexa: no masses seen, no cysts seen bilaterally  Uterus: anterverted with endometrial stripe  7.6 mm Additional: normal  Review of Systems: Review of Systems  Constitutional: Negative.   HENT: Negative.   Eyes: Negative.   Respiratory: Negative.   Cardiovascular: Negative.   Gastrointestinal: Negative.   Genitourinary:       Minimal lower abdominal pain (baseline for her)  Musculoskeletal: Negative.   Skin: Negative.   Neurological: Negative.   Psychiatric/Behavioral: Negative.     Past Medical History:  Diagnosis Date  . Migraines   . Ovarian cyst 2018    Past Surgical History:  Procedure Laterality Date  . TUBAL LIGATION      Gynecologic History:  Patient's last menstrual period was 12/28/2016. Contraception: tubal ligation  Family History  Problem Relation Age of Onset  . Diabetes Father   . Stroke Maternal Grandfather     Social History   Social History  . Marital status: Single    Spouse name: N/A  . Number of children: N/A  . Years of education: N/A   Occupational History  . Not on file.   Social History Main Topics  . Smoking status: Current Every Day Smoker    Packs/day: 1.00    Types: Cigarettes  . Smokeless tobacco: Never Used  . Alcohol use No  . Drug use: No  . Sexual activity: Not on file   Other Topics Concern  . Not on file   Social History Narrative  . No narrative on file    Allergies: No Known Allergies  Medications:   Medication Sig Start Date End Date Taking? Authorizing Provider  gabapentin (NEURONTIN) 600 MG tablet Take 600  mg by mouth 2 (two) times daily.     Historical Provider, MD  lidocaine (XYLOCAINE) 2 % solution Use as directed 10 mLs in the mouth or throat every 4 (four) hours as needed (for sore throat). Patient not taking: Reported on 11/26/2016 09/19/16   Sudie Grumbling, NP    Physical Exam Vitals: Blood pressure 110/70, pulse 90, height  (1.549 m), weight 123 lb (55.8 kg), last menstrual period 12/28/2016.  General: NAD HEENT: normocephalic, anicteric Pulmonary: No increased work of breathing Extremities: no edema, erythema, or tenderness Neurologic: Grossly intact, normal gait Psychiatric: mood appropriate, affect full   Assessment: 37 y.o. [id number] with left ovarian cyst, now resolved and lower abdominal pain.  Plan:  1) left ovarian cyst resolved. 2) lower abdominal pain, resolved.  Follow up as needed.   Thomasene Mohair, MD 01/02/2017 10:08 AM

## 2018-10-30 IMAGING — US US PELVIS COMPLETE
1 series · 13 of 25 positions shown · non-contrast
Comparison: None

CLINICAL DATA: Suprapubic pain as well as diffuse pelvic pain 4-5
days.

EXAM:
TRANSABDOMINAL AND TRANSVAGINAL ULTRASOUND OF PELVIS
TECHNIQUE: Both transabdominal and transvaginal ultrasound examinations of the
pelvis were performed. Transabdominal technique was performed for
global imaging of the pelvis including uterus, ovaries, adnexal
regions, and pelvic cul-de-sac. It was necessary to proceed with
endovaginal exam following the transabdominal exam to visualize the
endometrium and ovaries.

[Series 1: us pelvis complete · 0.22mm/px · 13 of 113 slices shown]
[im 1/113]
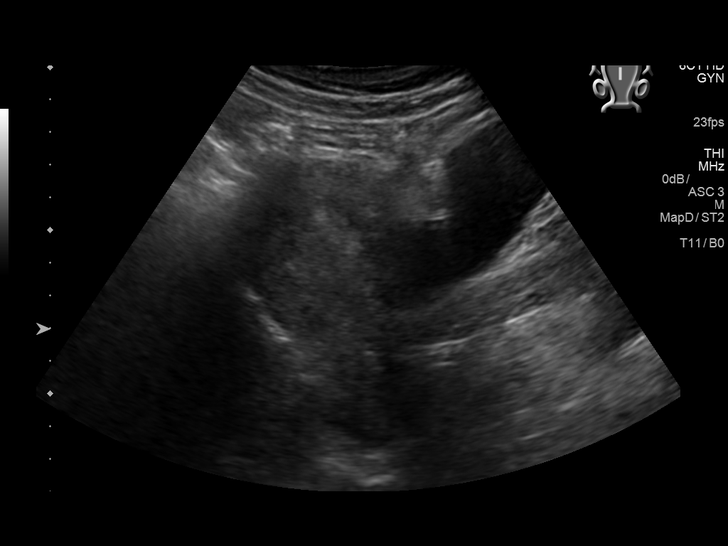
[im 10/113]
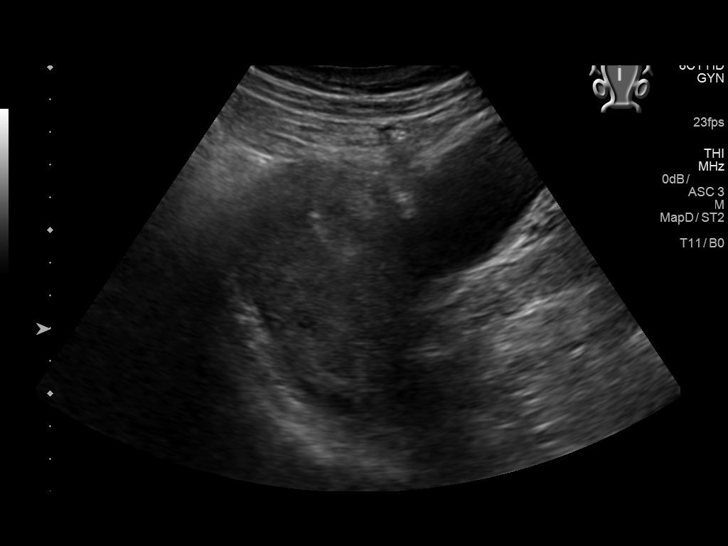
[im 19/113]
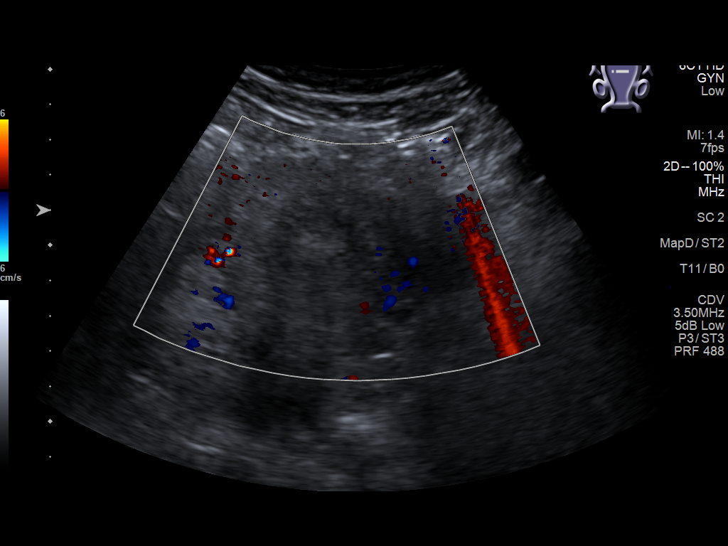
[im 29/113]
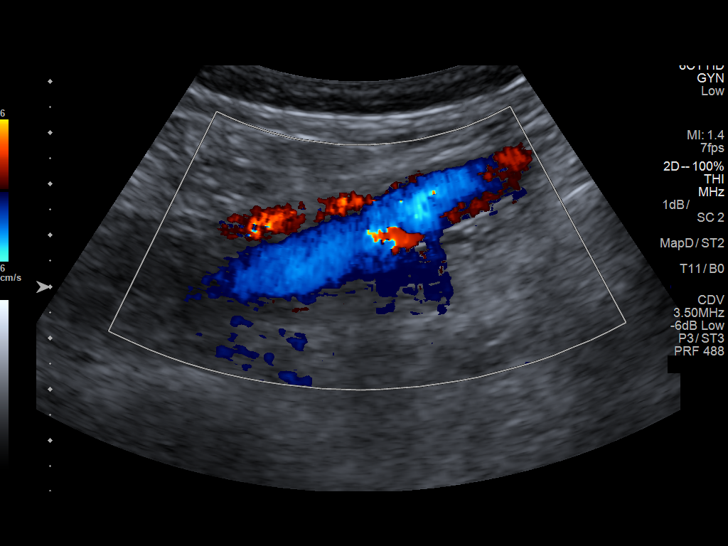
[im 38/113]
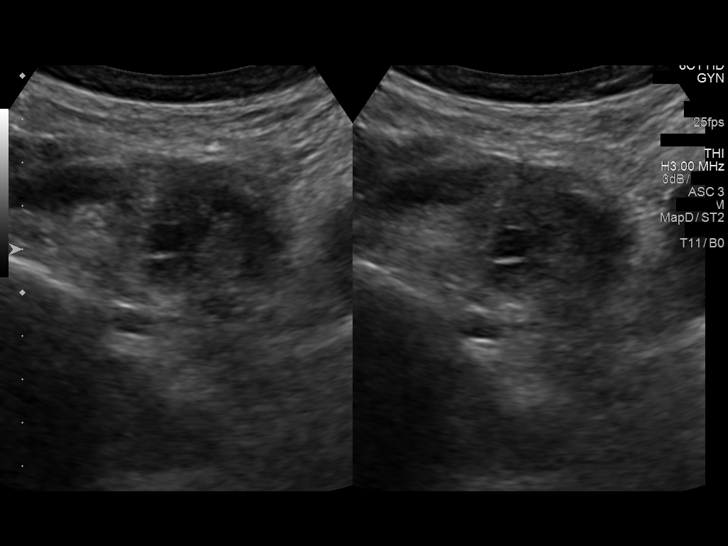
[im 47/113]
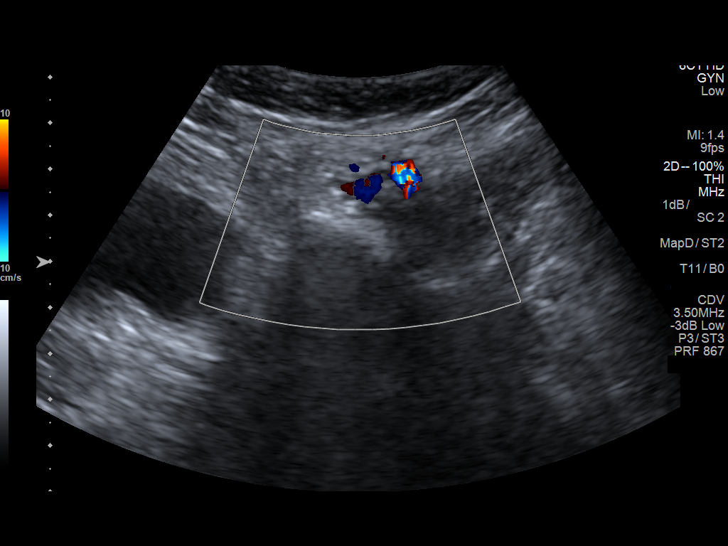
[im 57/113]
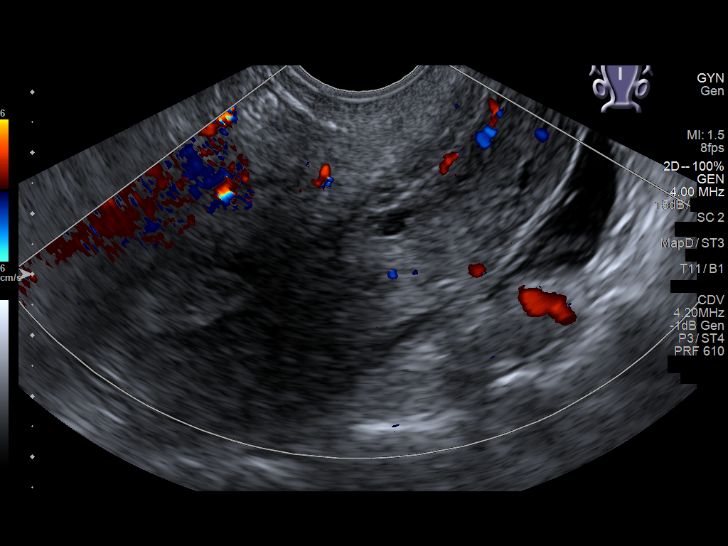
[im 66/113]
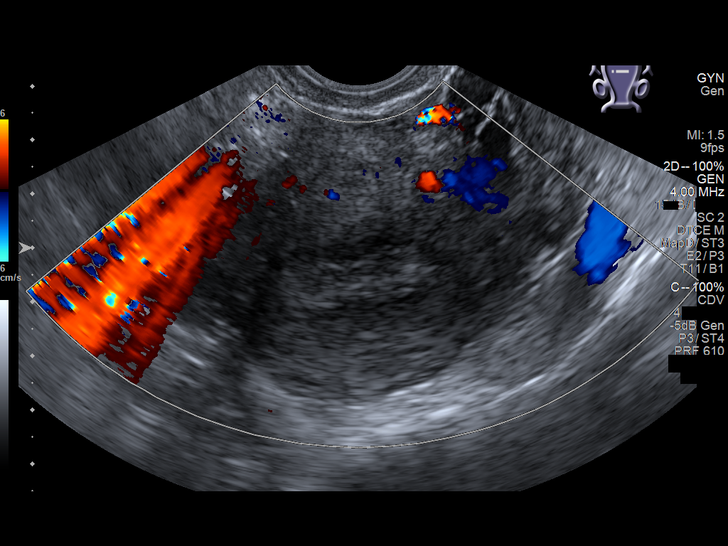
[im 75/113]
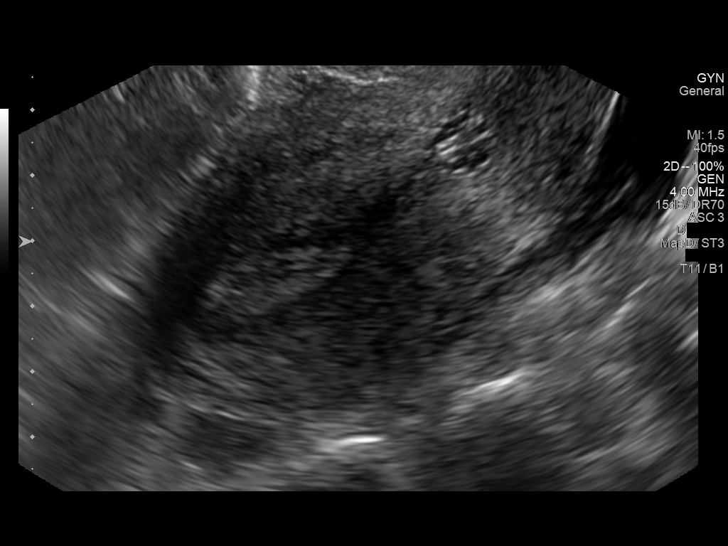
[im 85/113]
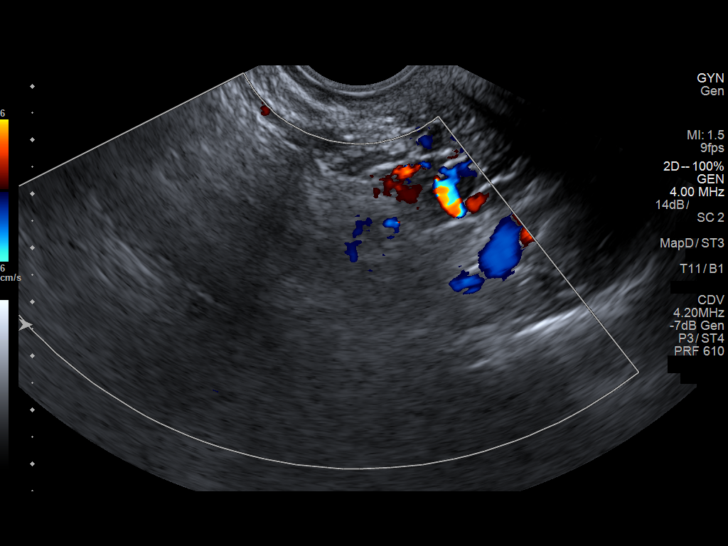
[im 94/113]
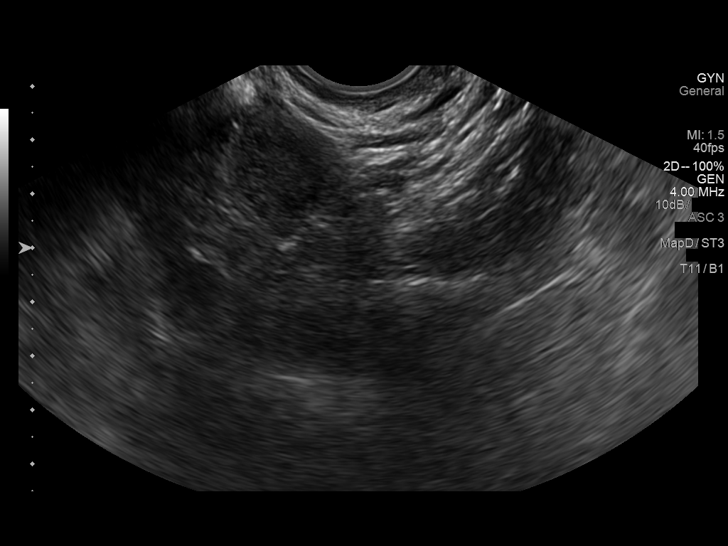
[im 103/113]
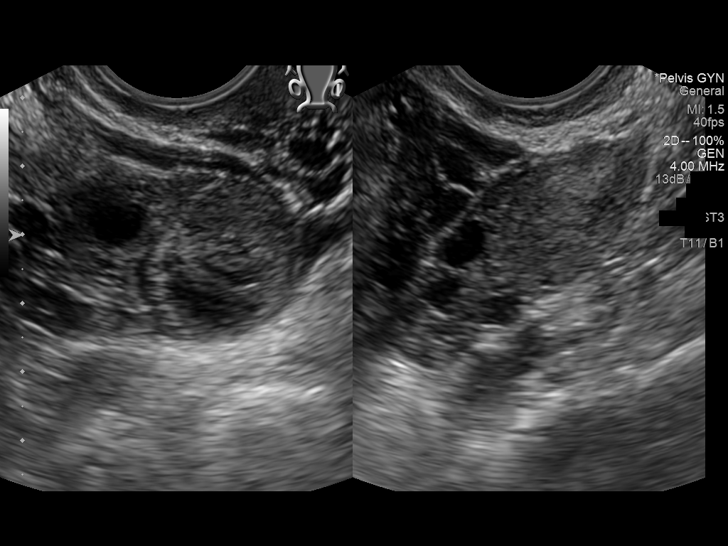
[im 113/113]
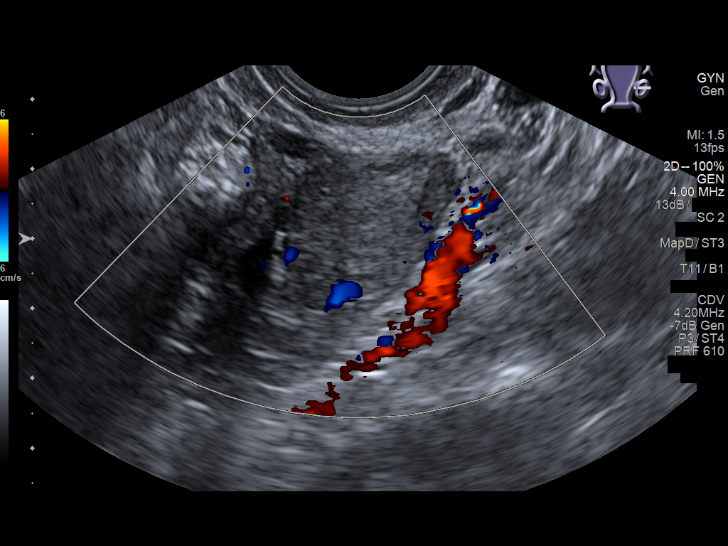

[13 of 25 positions shown; findings below may reference images not displayed]

FINDINGS: Uterus

Measurements: 5.3 x 6.9 x 9.0 cm. No fibroids or other mass
visualized.

Endometrium

Thickness: 10 mm.  No focal abnormality visualized.

Right ovary

Only visualized transabdominally. Measurements: 2.9 x 2.9 x 2.8 cm.
Normal appearance/no adnexal mass. Normal color flow.

Left ovary

Measurements: 2.4 x 2.5 x 4.2 cm. Normal color flow. Oval 2.5 cm
complex cystic structure without internal vascularity. This finding
may be seen due to hemorrhagic cyst, endometrioma, dermoid or
tubo-ovarian abscess.

Other findings

Small amount of free pelvic fluid.
IMPRESSION: 2.5 cm complex left ovarian cystic mass. Differential diagnosis
includes endometrioma, tubo-ovarian abscess, dermoid or hemorrhagic
cyst. Small amount free pelvic fluid. Recommend follow-up ultrasound
6 weeks.

Uterus and right ovary.

## 2019-07-31 DIAGNOSIS — D649 Anemia, unspecified: Secondary | ICD-10-CM

## 2019-07-31 HISTORY — DX: Anemia, unspecified: D64.9

## 2019-09-13 ENCOUNTER — Telehealth: Payer: Self-pay | Admitting: Obstetrics & Gynecology

## 2019-09-13 NOTE — Telephone Encounter (Signed)
[facility] referring for (Urgent) Heavy irregular periods. vmb not set up

## 2019-09-19 NOTE — Progress Notes (Signed)
[facility]   Chief Complaint  Patient presents with  . Menorrhagia    pelvic pain entire area x one year    HPI:      [name] is a 38 y.o. [phone] who LMP was Patient's last menstrual period was 09/06/2019 (exact date)., presents today for eval of menorrhagia, referred by PCP. Pt's menses used to be monthly, lasting 3-4 days, heavy flow changing products Q 1-1/2 hrs. No clots, no BTB, no dysmen. Now having bleeding almost daily for the past 6 or so months. Flow is heavier, changing products Q20-30 min sometimes and with quarter-sized clots. Has labor-type pains/dysmen, not improved with tylenol. Has had syncope recently and diagnosed with anemia with PCP.  Hx of ovar cyst 2018 She is sex active, sometimes with deep dyspareunia and bleeding with sex. No new partners. S/p BTL.  Hx of migraines with aura and tobacco use. Did depo in distant past without problems.  Last pap with PCP normal. Hx of pos HPV DNA and neg cells on 2018 pap.  FH breast and ovar cancer in her mat aunt, genetic testing not done.    Past Medical History:  Diagnosis Date  . Anemia 07/2019  . Migraine with aura   . Ovarian cyst 2018     Past Surgical History:  Procedure Laterality Date  . TUBAL LIGATION      Family History  Problem Relation Age of Onset  . Diabetes Father   . Stroke Maternal Grandfather   . Breast cancer Maternal Aunt        40s  . Ovarian cancer Maternal Aunt        40s  . Cancer Maternal Grandmother   . Lung cancer Paternal Grandmother     Social History   Socioeconomic History  . Marital status: Single    Spouse name: Not on file  . Number of children: Not on file  . Years of education: Not on file  . Highest education level: Not on file  Occupational History  . Not on file  Tobacco Use  . Smoking status: Current Every Day Smoker    Packs/day: 1.00    Types: Cigarettes  . Smokeless tobacco: Never Used  Substance and Sexual  Activity  . Alcohol use: No  . Drug use: No  . Sexual activity: Yes    Birth control/protection: Surgical    Comment: Tubal ligation  Other Topics Concern  . Not on file  Social History Narrative  . Not on file   Social Determinants of Health   Financial Resource Strain:   . Difficulty of Paying Living Expenses: Not on file  Food Insecurity:   . Worried About Programme researcher, broadcasting/film/videounning Out of Food in the Last Year: Not on file  . Ran Out of Food in the Last Year: Not on file  Transportation Needs:   . Lack of Transportation (Medical): Not on file  . Lack of Transportation (Non-Medical): Not on file  Physical Activity:   . Days of Exercise per Week: Not on file  . Minutes of Exercise per Session: Not on file  Stress:   . Feeling of Stress : Not on file  Social Connections:   . Frequency of Communication with Friends and Family: Not on file  . Frequency of Social Gatherings with Friends and Family: Not on file  . Attends Religious Services: Not on file  . Active Member of Clubs or Organizations: Not on file  . Attends Club  or Organization Meetings: Not on file  . Marital Status: Not on file  Intimate Partner Violence:   . Fear of Current or Ex-Partner: Not on file  . Emotionally Abused: Not on file  . Physically Abused: Not on file  . Sexually Abused: Not on file    Outpatient Medications Prior to Visit  Medication Sig Dispense Refill  . gabapentin (NEURONTIN) 600 MG tablet Take 600 mg by mouth 2 (two) times daily.     Marland Kitchen lidocaine (XYLOCAINE) 2 % solution Use as directed 10 mLs in the mouth or throat every 4 (four) hours as needed (for sore throat). (Patient not taking: Reported on 11/26/2016) 100 mL 0   No facility-administered medications prior to visit.      ROS:  Review of Systems  Constitutional: Positive for fatigue. Negative for fever and unexpected weight change.  Respiratory: Negative for cough, shortness of breath and wheezing.   Cardiovascular: Negative for chest pain,  palpitations and leg swelling.  Gastrointestinal: Negative for blood in stool, constipation, diarrhea, nausea and vomiting.  Endocrine: Negative for cold intolerance, heat intolerance and polyuria.  Genitourinary: Positive for dyspareunia, menstrual problem and pelvic pain. Negative for dysuria, flank pain, frequency, genital sores, hematuria, urgency, vaginal bleeding, vaginal discharge and vaginal pain.  Musculoskeletal: Negative for back pain, joint swelling and myalgias.  Skin: Negative for rash.  Neurological: Positive for dizziness, syncope and headaches. Negative for light-headedness and numbness.  Hematological: Negative for adenopathy.  Psychiatric/Behavioral: Negative for agitation, confusion, sleep disturbance and suicidal ideas. The patient is not nervous/anxious.   BREAST: No symptoms   OBJECTIVE:   Vitals:  BP 100/80   Ht 5\' 1"  (1.549 m)   Wt 122 lb (55.3 kg)   LMP 09/06/2019 (Exact Date)   BMI 23.05 kg/m   Physical Exam Vitals reviewed.  Constitutional:      Appearance: She is well-developed.  Pulmonary:     Effort: Pulmonary effort is normal.  Abdominal:     Palpations: Abdomen is soft.     Tenderness: There is no abdominal tenderness. There is no guarding or rebound.  Genitourinary:    General: Normal vulva.     Pubic Area: No rash.      Labia:        Right: No rash, tenderness or lesion.        Left: No rash, tenderness or lesion.      Vagina: Normal. No vaginal discharge, erythema or tenderness.     Cervix: Normal.     Uterus: Normal. Tender. Not enlarged.      Adnexa: Right adnexa normal and left adnexa normal.       Right: No mass or tenderness.         Left: No mass or tenderness.    Musculoskeletal:        General: Normal range of motion.     Cervical back: Normal range of motion.  Skin:    General: Skin is warm and dry.  Neurological:     General: No focal deficit present.     Mental Status: She is alert and oriented to person, place, and  time.  Psychiatric:        Mood and Affect: Mood normal.        Behavior: Behavior normal.        Thought Content: Thought content normal.        Judgment: Judgment normal.     Assessment/Plan: DUB (dysfunctional uterine bleeding) - Plan: 14/04/2019 Transvaginal Non-OB, TSH + free  T4, Prolactin; Check labs and GYN ultrasound. Will f/u with results. If neg, discussed tx options including POPs, depo, IUD, ablation, hyst.   Pelvic pain - Plan: US Transvaginal Non-OB; Check Gyn ultrasound. Will f/u with results.   Family history of breast cancer and ovarian cancer--MyRisk testing discussed and pt declines.     Return in about 1 day (around 09/21/2019) for GYN u/s for DUB, pelvic pain--[facility] to call pt.  Manju Kulkarni B. Tanor Glaspy, PA-C 09/20/2019 2:47 PM

## 2019-09-20 ENCOUNTER — Encounter: Payer: Self-pay | Admitting: Obstetrics and Gynecology

## 2019-09-20 ENCOUNTER — Ambulatory Visit (INDEPENDENT_AMBULATORY_CARE_PROVIDER_SITE_OTHER): Payer: Medicaid Other | Admitting: Obstetrics and Gynecology

## 2019-09-20 ENCOUNTER — Other Ambulatory Visit: Payer: Self-pay

## 2019-09-20 VITALS — BP 100/80 | Ht 61.0 in | Wt 122.0 lb

## 2019-09-20 DIAGNOSIS — Z803 Family history of malignant neoplasm of breast: Secondary | ICD-10-CM

## 2019-09-20 DIAGNOSIS — Z8041 Family history of malignant neoplasm of ovary: Secondary | ICD-10-CM

## 2019-09-20 DIAGNOSIS — N938 Other specified abnormal uterine and vaginal bleeding: Secondary | ICD-10-CM

## 2019-09-20 DIAGNOSIS — R102 Pelvic and perineal pain: Secondary | ICD-10-CM | POA: Diagnosis not present

## 2019-09-20 NOTE — Patient Instructions (Signed)
I value your feedback and entrusting us with your care. If you get a [facility] patient survey, I would appreciate you taking the time to let us know about your experience today. Thank you!  As of September 08, 2019, your lab results will be released to your MyChart immediately, before I even have a chance to see them. Please give me time to review them and contact you if there are any abnormalities. Thank you for your patience.  

## 2019-09-21 LAB — PROLACTIN: Prolactin: 9.2 ng/mL (ref 4.8–23.3)

## 2019-09-21 LAB — TSH+FREE T4
Free T4: 0.96 ng/dL (ref 0.82–1.77)
TSH: 1.14 u[IU]/mL (ref 0.450–4.500)

## 2019-09-27 ENCOUNTER — Other Ambulatory Visit: Payer: Self-pay

## 2019-09-27 ENCOUNTER — Ambulatory Visit (INDEPENDENT_AMBULATORY_CARE_PROVIDER_SITE_OTHER): Payer: Medicaid Other

## 2019-09-27 DIAGNOSIS — N83202 Unspecified ovarian cyst, left side: Secondary | ICD-10-CM

## 2019-09-27 DIAGNOSIS — N938 Other specified abnormal uterine and vaginal bleeding: Secondary | ICD-10-CM

## 2019-09-27 DIAGNOSIS — R102 Pelvic and perineal pain: Secondary | ICD-10-CM

## 2019-09-29 ENCOUNTER — Telehealth: Payer: Self-pay | Admitting: Obstetrics and Gynecology

## 2019-09-29 NOTE — Telephone Encounter (Signed)
Spoke with pt re: GYN u/s results. Pt with hx of DUB and severe dysmen for the past 6 months. Also now with anemia. Had neg TSH/prolactin labs. GYN u/s with EM=6 mm and LTO complex cyst.  Discussed bleeding control with prog only options vs ablation.  Pt would like ablation. RTO with MD for consultation/EMB.   ULTRASOUND REPORT  Location: [facility]  Date of Service: 09/27/2019     Indications:Abnormal Uterine Bleeding Findings:  The uterus is anteverted and measures 8.8 x 5.4 x 4.7 cm. Echo texture is homogenous without evidence of focal masses. The Endometrium measures 9.2 mm.  Right Ovary measures 1.9 x 1.4 x 1.0 cm. It is normal in appearance. Left Ovary measures 3.7 x 3.0 x 3.3 cm. It is not normal in appearance. There is a complex cyst in the left ovary measuring 28 x 23 x 22 mm. No blood flow is seen within this cyst.  Survey of the adnexa demonstrates no adnexal masses. There is no free fluid in the cul de sac.  Impression: 1. Normal appearing uterus and right ovary.  2. There is a complex cyst in the left ovary.   Recommendations: 1.Clinical correlation with the patient's History and Physical Exam.   Gweneth Dimitri, RT

## 2019-10-10 ENCOUNTER — Ambulatory Visit (INDEPENDENT_AMBULATORY_CARE_PROVIDER_SITE_OTHER): Payer: Medicaid Other | Admitting: Obstetrics & Gynecology

## 2019-10-10 ENCOUNTER — Other Ambulatory Visit: Payer: Self-pay

## 2019-10-10 ENCOUNTER — Other Ambulatory Visit (HOSPITAL_COMMUNITY)
Admission: RE | Admit: 2019-10-10 | Discharge: 2019-10-10 | Disposition: A | Discharge status: Home / Self Care | Payer: Medicaid Other | Source: Ambulatory Visit | Attending: Obstetrics & Gynecology | Admitting: Obstetrics & Gynecology

## 2019-10-10 ENCOUNTER — Encounter: Payer: Self-pay | Admitting: Obstetrics & Gynecology

## 2019-10-10 VITALS — BP 90/60 | Ht 61.0 in | Wt 121.0 lb

## 2019-10-10 DIAGNOSIS — Z124 Encounter for screening for malignant neoplasm of cervix: Secondary | ICD-10-CM

## 2019-10-10 DIAGNOSIS — N946 Dysmenorrhea, unspecified: Secondary | ICD-10-CM

## 2019-10-10 DIAGNOSIS — N938 Other specified abnormal uterine and vaginal bleeding: Secondary | ICD-10-CM | POA: Diagnosis present

## 2019-10-10 DIAGNOSIS — N921 Excessive and frequent menstruation with irregular cycle: Secondary | ICD-10-CM

## 2019-10-10 NOTE — Progress Notes (Signed)
HPI:      [name] is a 39 y.o. [phone] who LMP was Patient's last menstrual period was 10/03/2019., presents today for a problem visit.  She complains of menometrorrhagia that  began several months ago and its severity is described as moderate.  She has irregular periods that are almost daily, and they are associated with severe menstrual cramping.  She has used the following for attempts at control: tampon and pad.  Previous evaluation: ultrasound showing 6 mm ES and no mass. Prior Diagnosis: dysfunctional uterine bleeding. Previous Treatment: none. Prior BTL, no desire for pregnancy  PMHx: She  has a past medical history of Anemia (07/2019), Migraine with aura, and Ovarian cyst (2018). Also,  has a past surgical history that includes Tubal ligation., family history includes Breast cancer in her maternal aunt; Cancer in her maternal grandmother; Diabetes in her father; Lung cancer in her paternal grandmother; Ovarian cancer in her maternal aunt; Stroke in her maternal grandfather.,  reports that she has been smoking cigarettes. She has been smoking about 1.00 pack per day. She has never used smokeless tobacco. She reports that she does not drink alcohol or use drugs.  She No current outpatient medications on file.  Also, has No Known Allergies.  Review of Systems  Constitutional: Negative for chills, fever and malaise/fatigue.  HENT: Negative for congestion, sinus pain and sore throat.   Eyes: Negative for blurred vision and pain.  Respiratory: Negative for cough and wheezing.   Cardiovascular: Negative for chest pain and leg swelling.  Gastrointestinal: Negative for abdominal pain, constipation, diarrhea, heartburn, nausea and vomiting.  Genitourinary: Negative for dysuria, frequency, hematuria and urgency.  Musculoskeletal: Negative for back pain, joint pain, myalgias and neck pain.  Skin: Negative for itching and rash.  Neurological: Negative for dizziness, tremors and weakness.   Endo/Heme/Allergies: Does not bruise/bleed easily.  Psychiatric/Behavioral: Negative for depression. The patient is not nervous/anxious and does not have insomnia.     Objective: BP 90/60   Ht 5\' 1"  (1.549 m)   Wt 121 lb (54.9 kg)   LMP 10/03/2019   BMI 22.86 kg/m  Physical Exam Constitutional:      General: She is not in acute distress.    Appearance: She is well-developed.  Genitourinary:     Pelvic exam was performed with patient supine.     Vagina and uterus normal.     No vaginal erythema or bleeding.     No cervical motion tenderness, discharge, polyp or nabothian cyst.     Uterus is mobile.     Uterus is not enlarged.     No uterine mass detected.    Uterus is midaxial.     No right or left adnexal mass present.     Right adnexa not tender.     Left adnexa not tender.  HENT:     Head: Normocephalic and atraumatic.     Nose: Nose normal.  Abdominal:     General: There is no distension.     Palpations: Abdomen is soft.     Tenderness: There is no abdominal tenderness.  Musculoskeletal:        General: Normal range of motion.  Neurological:     Mental Status: She is alert and oriented to person, place, and time.     Cranial Nerves: No cranial nerve deficit.  Skin:    General: Skin is warm and dry.  Psychiatric:        Attention and Perception: Attention normal.  Mood and Affect: Mood and affect normal.        Speech: Speech normal.        Behavior: Behavior normal.        Thought Content: Thought content normal.        Judgment: Judgment normal.     ASSESSMENT/PLAN:  menometrorrhagia  Problem List Items Addressed This Visit      Genitourinary   DUB (dysfunctional uterine bleeding) - Primary   Relevant Orders   Surgical pathology   Dysmenorrhea   Relevant Orders   Surgical pathology    Other Visit Diagnoses    Screening for cervical cancer       Relevant Orders   Cytology - PAP   Menometrorrhagia       Relevant Orders   Surgical  pathology       Endometrial Biopsy After discussion with the patient regarding her abnormal uterine bleeding I recommended that she proceed with an endometrial biopsy for further diagnosis. The risks, benefits, alternatives, and indications for an endometrial biopsy were discussed with the patient in detail. She understood the risks including infection, bleeding, cervical laceration and uterine perforation.  Verbal consent was obtained.   PROCEDURE NOTE:  Pipelle endometrial biopsy was performed using aseptic technique with iodine preparation.  The uterus was sounded to a length of 7 cm.  Adequate sampling was obtained with minimal blood loss.  The patient tolerated the procedure well.  Disposition will be pending pathology.  Patient was told that it is normal to have menstrual bleeding after an endometrial ablation, only about 80% of patients become amenorrheic, 10% of patients have normal or light periods, and 10% of patients have no change in their bleeding pattern and may need further intervention.  She was told she will observe her periods for a few months after her ablation to see what her periods will be like; it is recommended to wait until at least three months after the procedure before making conclusions about how periods are going to be like after an ablation.  Medications discussed for before and after the procedure. Rx given.   Barnett Applebaum, MD, [facility], [facility] 10/10/2019  10:29 AM

## 2019-10-10 NOTE — Patient Instructions (Signed)
Endometrial Biopsy, Care After This sheet gives you information about how to care for yourself after your procedure. Your health care provider may also give you more specific instructions. If you have problems or questions, contact your health care provider. What can I expect after the procedure? After the procedure, it is common to have:  Mild cramping.  A small amount of vaginal bleeding for a few days. This is normal. Follow these instructions at home:   Take over-the-counter and prescription medicines only as told by your health care provider.  Do not douche, use tampons, or have sexual intercourse until your health care provider approves.  Return to your normal activities as told by your health care provider. Ask your health care provider what activities are safe for you.  Follow instructions from your health care provider about any activity restrictions, such as restrictions on strenuous exercise or heavy lifting. Contact a health care provider if:  You have heavy bleeding, or bleed for longer than 2 days after the procedure.  You have bad smelling discharge from your vagina.  You have a fever or chills.  You have a burning sensation when urinating or you have difficulty urinating.  You have severe pain in your lower abdomen. Get help right away if:  You have severe cramps in your stomach or back.  You pass large blood clots.  Your bleeding increases.  You become weak or light-headed, or you pass out. Summary  After the procedure, it is common to have mild cramping and a small amount of vaginal bleeding for a few days.  Do not douche, use tampons, or have sexual intercourse until your health care provider approves.  Return to your normal activities as told by your health care provider. Ask your health care provider what activities are safe for you. This information is not intended to replace advice given to you by your health care provider. Make sure you discuss any  questions you have with your health care provider. Document Revised: 08/28/2017 Document Reviewed: 10/01/2016 Elsevier Patient Education  2020 Elsevier Inc. Endometrial Ablation Endometrial ablation is a procedure that destroys the thin inner layer of the lining of the uterus (endometrium). This procedure may be done:  To stop heavy periods.  To stop bleeding that is causing anemia.  To control irregular bleeding.  To treat bleeding caused by small tumors (fibroids) in the endometrium. This procedure is often an alternative to major surgery, such as removal of the uterus and cervix (hysterectomy). As a result of this procedure:  You may not be able to have children. However, if you are premenopausal (you have not gone through menopause): ? You may still have a small chance of getting pregnant. ? You will need to use a reliable method of birth control after the procedure to prevent pregnancy.  You may stop having a menstrual period, or you may have only a small amount of bleeding during your period. Menstruation may return several years after the procedure. Tell a health care provider about:  Any allergies you have.  All medicines you are taking, including vitamins, herbs, eye drops, creams, and over-the-counter medicines.  Any problems you or family members have had with the use of anesthetic medicines.  Any blood disorders you have.  Any surgeries you have had.  Any medical conditions you have. What are the risks? Generally, this is a safe procedure. However, problems may occur, including:  A hole (perforation) in the uterus or bowel.  Infection of the uterus, bladder, or   vagina.  Bleeding.  Damage to other structures or organs.  An air bubble in the lung (air embolus).  Problems with pregnancy after the procedure.  Failure of the procedure.  Decreased ability to diagnose cancer in the endometrium. What happens before the procedure?  You will have tests of your  endometrium to make sure there are no pre-cancerous cells or cancer cells present.  You may have an ultrasound of the uterus.  You may be given medicines to thin the endometrium.  Ask your health care provider about: ? Changing or stopping your regular medicines. This is especially important if you take diabetes medicines or blood thinners. ? Taking medicines such as aspirin and ibuprofen. These medicines can thin your blood. Do not take these medicines before your procedure if your doctor tells you not to.  Plan to have someone take you home from the hospital or clinic. What happens during the procedure?   You will lie on an exam table with your feet and legs supported as in a pelvic exam.  To lower your risk of infection: ? Your health care team will wash or sanitize their hands and put on germ-free (sterile) gloves. ? Your genital area will be washed with soap.  An IV tube will be inserted into one of your veins.  You will be given a medicine to help you relax (sedative).  A surgical instrument with a light and camera (resectoscope) will be inserted into your vagina and moved into your uterus. This allows your surgeon to see inside your uterus.  Endometrial tissue will be removed using one of the following methods: ? Radiofrequency. This method uses a radiofrequency-alternating electric current to remove the endometrium. ? Cryotherapy. This method uses extreme cold to freeze the endometrium. ? Heated-free liquid. This method uses a heated saltwater (saline) solution to remove the endometrium. ? Microwave. This method uses high-energy microwaves to heat up the endometrium and remove it. ? Thermal balloon. This method involves inserting a catheter with a balloon tip into the uterus. The balloon tip is filled with heated fluid to remove the endometrium. The procedure may vary among health care providers and hospitals. What happens after the procedure?  Your blood pressure, heart  rate, breathing rate, and blood oxygen level will be monitored until the medicines you were given have worn off.  As tissue healing occurs, you may notice vaginal bleeding for 4-6 weeks after the procedure. You may also experience: ? Cramps. ? Thin, watery vaginal discharge that is light pink or brown in color. ? A need to urinate more frequently than usual. ? Nausea.  Do not drive for 24 hours if you were given a sedative.  Do not have sex or insert anything into your vagina until your health care provider approves. Summary  Endometrial ablation is done to treat the many causes of heavy menstrual bleeding.  The procedure may be done only after medications have been tried to control the bleeding.  Plan to have someone take you home from the hospital or clinic. This information is not intended to replace advice given to you by your health care provider. Make sure you discuss any questions you have with your health care provider. Document Revised: 03/02/2018 Document Reviewed: 10/02/2016 Elsevier Patient Education  2020 Elsevier Inc.  

## 2019-10-12 LAB — CYTOLOGY - PAP
Comment: NEGATIVE
Diagnosis: NEGATIVE
High risk HPV: NEGATIVE

## 2019-10-12 LAB — SURGICAL PATHOLOGY

## 2019-10-13 ENCOUNTER — Telehealth: Payer: Self-pay | Admitting: Obstetrics & Gynecology

## 2019-10-13 ENCOUNTER — Other Ambulatory Visit: Payer: Self-pay | Admitting: Obstetrics & Gynecology

## 2019-10-13 DIAGNOSIS — N921 Excessive and frequent menstruation with irregular cycle: Secondary | ICD-10-CM

## 2019-10-13 NOTE — Telephone Encounter (Signed)
Patient is aware of in-office Hysteroscopy D&C with Ablation on 10/26/19 @ 1:30pm w/ Dr. Tiburcio Pea. Patient is aware to arrive @ 1:15pm for phone check-in. Patient is aware she will need a driver. Patient confirmed Medicaid. Patient said she has the prescription, but would like to know if she will be given anything for pain during procedure. Patient requests Dr. Tiburcio Pea just send a message in MyChart.

## 2019-10-13 NOTE — Telephone Encounter (Signed)
Spoke w/Binh. He has this on his calendar.

## 2019-10-13 NOTE — Telephone Encounter (Signed)
Left detailed message for [place].

## 2019-10-13 NOTE — Telephone Encounter (Signed)
-----   Message from Nadara Mustard, MD sent at 10/10/2019 10:26 AM EST ----- Regarding: office surgery Surgery Booking Request Patient Full Name:  NIKKIE LIMING  MRN: [id number]  DOB: 1981/07/09  Surgeon: Letitia Libra, MD  Requested Surgery Date and Time: Any after 1/19 Primary Diagnosis AND Code: Menometrorrhagia, Dysmenorrhea Secondary Diagnosis and Code:  Surgical Procedure: Hysteroscopy D&C with Ablation L&D Notification: No Admission Status: IN OFFICE Length of Surgery: 30 min Special Case Needs: No H&P: No Phone Interview???:  No Interpreter: No Language:  Medical Clearance:  No Special Scheduling Instructions: no Any known health/anesthesia issues, diabetes, sleep apnea, latex allergy, defibrillator/pacemaker?: No Acuity: P3   (P1 highest, P2 delay may cause harm, P3 low, elective gyn, P4 lowest)

## 2019-10-13 NOTE — Progress Notes (Signed)
Surgery Booking Request- IN OFFICE    ALREADY HAS CONSENT, NO NEED FOR PREOP Patient Full Name:  MYKALA MCCREADY  MRN: [id number]  DOB: Nov 15, 1980  Surgeon: Letitia Libra, MD  Requested Surgery Date and Time: Any Primary Diagnosis AND Code: Menometrorrhagia N92.1 Secondary Diagnosis and Code:  Surgical Procedure: Hysteroscopy D&C with Ablation L&D Notification: No Admission Status: IN OFFICE PROCEDURE Length of Surgery: 30 min Special Case Needs: Barbarann Ehlers rep H&P: No Phone Interview???:  No Interpreter: No Language:  Medical Clearance:  No Special Scheduling Instructions: none Any known health/anesthesia issues, diabetes, sleep apnea, latex allergy, defibrillator/pacemaker?: No Acuity: P3   (P1 highest, P2 delay may cause harm, P3 low, elective gyn, P4 lowest)

## 2019-10-26 ENCOUNTER — Ambulatory Visit (INDEPENDENT_AMBULATORY_CARE_PROVIDER_SITE_OTHER): Payer: Medicaid Other | Admitting: Obstetrics & Gynecology

## 2019-10-26 ENCOUNTER — Other Ambulatory Visit: Payer: Self-pay

## 2019-10-26 ENCOUNTER — Encounter: Payer: Self-pay | Admitting: Obstetrics & Gynecology

## 2019-10-26 VITALS — BP 120/80

## 2019-10-26 DIAGNOSIS — N921 Excessive and frequent menstruation with irregular cycle: Secondary | ICD-10-CM | POA: Diagnosis not present

## 2019-10-26 NOTE — Progress Notes (Signed)
Minerva Endometrial Ablation  Operative Report   Place of Surgery: [facility] Pre-Operative Diagnosis: Menorrhagia Procedure: Minerva Endometrial Ablation  Surgeon: Annamarie Major, MD  Anesthesia: Paracervical Block   Operative Technique:  The patient was prepped and draped in the usual manner for Hysteroscopy with Minerva. Patient had voided prior to coming to the OR. A short weighted speculum was placed into the vagina and the anterior lip of the cervix was grasped with a single tooth tenaculum. Deep intra-cervical block was placed with a mixture of Marcaine 0.5% (6 cc's) and Lidocaine 1% (6 cc's) by placing 3 cc's into each quadrant with a spinal needle. The endocervical canal was measured to 2.5 centimeters. The uterus was then sounded to 7 centimeters. The cervix was dilated to 8 mm with Hegar dilators.  It was decided to proceed with the Minerva ablation. The device was set for the appropriate cavity length and was placed into the uterus and deployed. The width was noted on the device to be in the approved Green Zone. The cervical balloon was inflated and the cervical seal was confirmed by the Minerva Multiplex Controller. The foot pedal was then pressed and the 2-part Uterine Integrity Test (UIT) confirmed the integrity of the cavity. Upon completion of the UIT, the treatment cycle lasted 120 seconds. The device was unlocked and removed.  At this point the procedure is complete and the patient was awakened and in stable condition.   She will be allowed to return to her home, provided that she is doing well. She will be instructed to call to report any increased bleeding, uncontrolled pain, fever, SOB, or any other significant change in her condition. She is given a postoperative instruction sheet and a follow up appointment for 2 weeks.   Annamarie Major, MD, [facility], [facility] 10/26/2019  2:18 PM

## 2019-11-11 ENCOUNTER — Other Ambulatory Visit: Payer: Self-pay

## 2019-11-11 ENCOUNTER — Encounter: Payer: Self-pay | Admitting: Obstetrics & Gynecology

## 2019-11-11 ENCOUNTER — Ambulatory Visit (INDEPENDENT_AMBULATORY_CARE_PROVIDER_SITE_OTHER): Payer: Medicaid Other | Admitting: Obstetrics & Gynecology

## 2019-11-11 VITALS — Ht 61.0 in | Wt 120.0 lb

## 2019-11-11 DIAGNOSIS — N921 Excessive and frequent menstruation with irregular cycle: Secondary | ICD-10-CM

## 2019-11-11 NOTE — Progress Notes (Signed)
Virtual Visit via Telephone Note  I connected with Cassandra Lane on 11/11/19 at 10:40 AM EST by telephone and verified that I am speaking with the correct person using two identifiers.   I discussed the limitations, risks, security and privacy concerns of performing an evaluation and management service by telephone and the availability of in person appointments. I also discussed with the patient that there may be a patient responsible charge related to this service. The patient expressed understanding and agreed to proceed. She was at home and I was in my office.  History of Present Illness:   Cassandra Lane is a 39 y.o. that had a endometrial ablation procedure due to menometrorrhagia approximately 2 weeks ago. Since that time, she states that she has had some discharge that is light pink.  No pain.  No heavy bleeding.  PMHx: She  has a past medical history of Anemia (07/2019), Migraine with aura, and Ovarian cyst (2018). Also,  has a past surgical history that includes Tubal ligation., family history includes Breast cancer in her maternal aunt; Cancer in her maternal grandmother; Diabetes in her father; Lung cancer in her paternal grandmother; Ovarian cancer in her maternal aunt; Stroke in her maternal grandfather.,  reports that she has been smoking cigarettes. She has been smoking about 1.00 pack per day. She has never used smokeless tobacco. She reports that she does not drink alcohol or use drugs. No current outpatient medications Also, has No Known Allergies..  Review of Systems  All other systems reviewed and are negative.   Observations/Objective: No exam today, due to telephone eVisit due to [facility] virus restriction on elective visits and procedures.  Prior visits reviewed along with ultrasounds/labs as indicated.  Assessment and Plan: 1. Menometrorrhagia S/p Ablation 2 weeks ago doing well Monitor cycles over mext few months  Follow Up Instructions: Annual in Jan 2022   I  discussed the assessment and treatment plan with the patient. The patient was provided an opportunity to ask questions and all were answered. The patient agreed with the plan and demonstrated an understanding of the instructions.   The patient was advised to call back or seek an in-person evaluation if the symptoms worsen or if the condition fails to improve as anticipated.  I provided 14 minutes of non-face-to-face time during this encounter.   Letitia Libra, MD

## 2020-04-17 ENCOUNTER — Ambulatory Visit: Payer: Medicaid Other | Admitting: Dermatology

## 2020-04-18 ENCOUNTER — Ambulatory Visit (INDEPENDENT_AMBULATORY_CARE_PROVIDER_SITE_OTHER): Payer: Medicaid Other | Admitting: Dermatology

## 2020-04-18 ENCOUNTER — Other Ambulatory Visit: Payer: Self-pay

## 2020-04-18 DIAGNOSIS — L409 Psoriasis, unspecified: Secondary | ICD-10-CM

## 2020-04-18 NOTE — Patient Instructions (Signed)
Side effects of Otezla (apremilast) include diarrhea, nausea, headache, upper respiratory infection, depression, and weight decrease (5-10%). It should only be taken by pregnant women after a discussion regarding risks and benefits with their doctor.  

## 2020-04-18 NOTE — Progress Notes (Signed)
   New Patient Visit  Subjective  Cassandra Lane is a 39 y.o. female who presents for the following: Psoriasis (patient has tried Associate Professor and topical prescriptions in the past which didn't help with the psoriasis and made her skin feel greasy. Patient has had psoriasis for years).  The following portions of the chart were reviewed this encounter and updated as appropriate:  Tobacco  Allergies  Meds  Problems  Med Hx  Surg Hx  Fam Hx     Review of Systems:  No other skin or systemic complaints except as noted in HPI or Assessment and Plan.  Objective  Well appearing patient in no apparent distress; mood and affect are within normal limits.  A focused examination was performed including scalp face, ears neck; trunk and extremities.  Relevant physical exam findings are noted in the Assessment and Plan.  Objective  Knees, elbows, toes, ears, face: Thick scale and plaques of the elbows, knees, brows, ears, and toes  Images               Assessment & Plan    Psoriasis, severe and persistent Knees, elbows, toes, ears, face  BSA - 8% patient has tried and failed Xtrac laser treatment and topical treatment. No hx of joint aches or pain. No hx of kidney or liver issues. Start Otezla - starter pack given patient to titrate up to 30mg  po QD. Side effects of Otezla (apremilast) include diarrhea, nausea, headache, upper respiratory infection, depression, and weight decrease (5-10%). It should only be taken by pregnant women after a discussion regarding risks and benefits with their doctor.  Return in about 6 weeks (around 05/30/2020) for [facility] follow up .  [facility], CMA, am acting as scribe for Maylene Roes, MD .  Documentation: I have reviewed the above documentation for accuracy and completeness, and I agree with the above.  Armida Sans, MD

## 2020-04-21 ENCOUNTER — Encounter: Payer: Self-pay | Admitting: Dermatology

## 2020-06-06 ENCOUNTER — Telehealth: Payer: Self-pay

## 2020-06-06 NOTE — Telephone Encounter (Signed)
Pt voicemail not set up, unable to leave message.   Tried to call pt regarding Cassandra Lane appeal with her insurance. We need pt to come to the office to sign an authorization of representation form to allow [place] to make the appeal on her behalf for her prescription.

## 2020-06-07 ENCOUNTER — Other Ambulatory Visit: Payer: Self-pay

## 2020-06-07 ENCOUNTER — Ambulatory Visit (INDEPENDENT_AMBULATORY_CARE_PROVIDER_SITE_OTHER): Payer: Medicaid Other | Admitting: Dermatology

## 2020-06-07 DIAGNOSIS — L409 Psoriasis, unspecified: Secondary | ICD-10-CM

## 2020-06-07 MED ORDER — CALCIPOTRIENE 0.005 % EX CREA: TOPICAL_CREAM | Freq: Two times a day (BID) | CUTANEOUS | 0 refills | Status: AC

## 2020-06-07 NOTE — Progress Notes (Signed)
   Follow-Up Visit   Subjective  Cassandra Lane is a 39 y.o. female who presents for the following: Psoriasis (6 week follow up - Otezla 30mg  qd. A few headaches and gets an upset stomach if she doesn't take with food, no diarrhea.).  The following portions of the chart were reviewed this encounter and updated as appropriate:  Tobacco  Allergies  Meds  Problems  Med Hx  Surg Hx  Fam Hx     Review of Systems:  No other skin or systemic complaints except as noted in HPI or Assessment and Plan.  Objective  Well appearing patient in no apparent distress; mood and affect are within normal limits.  A focused examination was performed including face, arms, legs. Relevant physical exam findings are noted in the Assessment and Plan.  Objective  Knees, elbows, ears, eyebrows: Pinkness and scale of left brow. Pinkness of elbows and knees.   Assessment & Plan  Psoriasis -severe on systemic oral Otezla treatment Knees, elbows, ears, eyebrows Improving -without significant side effects but some mild side effects  Will plan to titrate up to 30mg  1 po bid - samples given. Otezla prescription is in the process of appeal with her insurance now.  Start Calcipotriene cream qd-bid  calcipotriene (DOVONOX) 0.005 % cream - Knees, elbows, ears, eyebrows  Return in about 6 weeks (around 07/19/2020) for psoriasis.  I, , CMA, am acting as scribe for 07/21/2020, MD .  Documentation: I have reviewed the above documentation for accuracy and completeness, and I agree with the above.  Joanie Coddington, MD

## 2020-06-08 ENCOUNTER — Encounter: Payer: Self-pay | Admitting: Dermatology

## 2020-06-11 ENCOUNTER — Telehealth: Payer: Self-pay

## 2020-06-11 DIAGNOSIS — L409 Psoriasis, unspecified: Secondary | ICD-10-CM

## 2020-06-11 NOTE — Telephone Encounter (Signed)
Per Medicaid, pt will need a TB and Hep B test for [facility] approval.   Fax number to fax results - [phone]

## 2020-06-11 NOTE — Telephone Encounter (Signed)
Orders sent for lab work. I called pt to inform her of tests needed from insurance. She stated that pharmacy informed her that the Calcipotriene prescribed on 06/07/20 is not covered by insurance and would be $400+ out of pocket. Is there an alternative we can send? (Medicaid pt)

## 2020-06-11 NOTE — Telephone Encounter (Signed)
TB and Hepatitis B testing for screening is not standard of care for [facility] treatment, but you may order them both if that is what insurance requires. Hepatitis B surface Antigen Hepatitis B antibodies Quantiferon Gold (TB test)

## 2020-06-11 NOTE — Telephone Encounter (Signed)
May use some Mometasone cream qd up to 5 days per week (Monday - Friday) prn aa psoriasis disp 60 g with 1 rf Keep fu appt

## 2020-06-11 NOTE — Addendum Note (Signed)
Addended by: Epifania Gore on: 06/11/2020 02:58 PM   Modules accepted: Orders

## 2020-06-12 ENCOUNTER — Other Ambulatory Visit: Payer: Self-pay

## 2020-06-12 DIAGNOSIS — L409 Psoriasis, unspecified: Secondary | ICD-10-CM

## 2020-06-12 MED ORDER — MOMETASONE FUROATE 0.1 % EX CREA: TOPICAL_CREAM | CUTANEOUS | 1 refills | Status: AC

## 2020-06-12 NOTE — Telephone Encounter (Signed)
Patient advised of medication change and discussed needing the labwork per Medicaid again.

## 2020-06-12 NOTE — Progress Notes (Signed)
Replacement rx

## 2020-06-12 NOTE — Telephone Encounter (Signed)
Rx sent 

## 2020-06-13 ENCOUNTER — Other Ambulatory Visit: Payer: Self-pay

## 2020-06-13 ENCOUNTER — Other Ambulatory Visit: Payer: Self-pay | Admitting: Dermatology

## 2020-06-15 LAB — HEPATITIS B SURFACE ANTIGEN: Hepatitis B Surface Ag: NEGATIVE

## 2020-06-15 LAB — QUANTIFERON-TB GOLD PLUS
QuantiFERON Mitogen Value: >10 IU/mL
QuantiFERON Nil Value: 0.02 IU/mL
QuantiFERON TB1 Ag Value: 0.03 IU/mL
QuantiFERON TB2 Ag Value: 0.03 IU/mL
QuantiFERON-TB Gold Plus: NEGATIVE

## 2020-06-15 LAB — HEPATITIS B SURFACE ANTIBODY,QUALITATIVE: Hep B Surface Ab, Qual: NONREACTIVE

## 2020-06-19 ENCOUNTER — Telehealth: Payer: Self-pay

## 2020-06-19 NOTE — Telephone Encounter (Signed)
Called pt no answer no answering machine 

## 2020-06-19 NOTE — Telephone Encounter (Signed)
-----   Message from Deirdre Evener, MD sent at 06/18/2020  2:12 PM EDT ----- Hepatitis B And  Quantiferon Gold (TB test)  Both normal/negative  Continue [place] - Send Rx if not already done.

## 2020-06-28 ENCOUNTER — Telehealth: Payer: Self-pay

## 2020-06-28 NOTE — Telephone Encounter (Signed)
Patient informed of lab results. She states she is doing well with Cassandra Lane (other than h/a) and would like to continue treatment, but her insurance will not cover treatment and it is a very expensive medication. Insurance will not approve Otezla until patient has tried and failed Humira, Enbrel, or Cosentyx. Please advise next steps in patient's treatment.

## 2020-07-02 NOTE — Telephone Encounter (Signed)
Cosentyx would be a viable option for this pts psoriasis treatment (but would not recommend Enbrel or Humira). If she wants to pursue Cosentyx, she may make an appt or keep her current appt in approx 6 wks.

## 2020-07-03 ENCOUNTER — Telehealth: Payer: Self-pay

## 2020-07-03 NOTE — Telephone Encounter (Signed)
I just wanted to add that we could apply for patient assistance program for her Henderson [place] to see if we can get her free medication for a year.

## 2020-07-03 NOTE — Telephone Encounter (Signed)
I called and spoke with the patient and she is interested in trying to get patient assistance for [facility]. I advised her that we would leave the forms for her at the check in desk and she can return them the day of her next appointment or sooner.

## 2020-07-03 NOTE — Telephone Encounter (Signed)
Discussed with patient conversation with Dr. Gwen Pounds. Patient will keep follow up appointment on 07/19/20 and we will discuss and possibly start Cosentyx authorization then. Patient will continue her [place] until that appointment.

## 2020-07-03 NOTE — Telephone Encounter (Signed)
If she does not want to pursue Cosentyx, definitely would pursue [facility] patient assistance. thanks

## 2020-07-19 ENCOUNTER — Ambulatory Visit: Payer: Medicaid Other | Admitting: Dermatology

## 2020-07-19 ENCOUNTER — Other Ambulatory Visit: Payer: Self-pay

## 2020-07-19 DIAGNOSIS — L409 Psoriasis, unspecified: Secondary | ICD-10-CM | POA: Diagnosis not present

## 2020-07-19 NOTE — Progress Notes (Signed)
   Follow-Up Visit   Subjective  Cassandra Lane is a 39 y.o. female who presents for the following: Psoriasis (Knees, elbows, ears, eyebrows, 6wk f/u, Otezla 30mg  1 po bid, Mometasone cr qhs 5d/wk, insurance denied appeal for , only s/e Otezla headaches).  The following portions of the chart were reviewed this encounter and updated as appropriate:  Tobacco  Allergies  Meds  Problems  Med Hx  Surg Hx  Fam Hx     Review of Systems:  No other skin or systemic complaints except as noted in HPI or Assessment and Plan.  Objective  Well appearing patient in no apparent distress; mood and affect are within normal limits.  A focused examination was performed including face, ears, elbows. Relevant physical exam findings are noted in the Assessment and Plan.  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  I advised her that [place] has less potential side effects, is an oral agent, and does not require laboratory screening.  Cosentyx is a systemic injection treatment that requires laboratory screening prior to initiation and has some immunosuppressive qualities although mild.  I would recommend Otezla prior to pursuing Cosentyx and the patient is doing well on Otezla without any significant side effects and she would like to continue at this time. Discussed biologics and s/e Discussed factors that worsen psoriasis  Cont Otezla 30mg  1 po bid, samples given x 2 Lot # [place] 02/2022 (patients insurance denied appeal, she will apply for patient assistance, if not approved will discuss Cosentyx again, since her insurance requires that before approving Otezla)  Cont Mometasone cr hs up to 5d/wk aa psoriasis  Reviewed  risks of biologics including immunosuppression, infections, injection site reaction, and failure to improve condition. Goal is control of skin condition, not cure.  Some older biologics such as Humira and Enbrel may slightly increase risk of malignancy and may worsen congestive heart failure. The use of biologics requires long term medication management, including periodic office visits and monitoring of blood work.   Other Related Medications calcipotriene (DOVONOX) 0.005 % cream mometasone (ELOCON) 0.1 % cream  Return in about 4 weeks (around 08/16/2020) for 4-6 wks psoriasis.   I, 03/2022, RMA, am acting as scribe for 08/18/2020, MD .  Documentation: I have reviewed the above documentation for accuracy and completeness, and I agree with the above.  Ardis Rowan, MD

## 2020-07-23 ENCOUNTER — Encounter: Payer: Self-pay | Admitting: Dermatology

## 2020-08-30 ENCOUNTER — Ambulatory Visit: Payer: Medicaid Other | Admitting: Dermatology
